# Patient Record
Sex: Male | Born: 1990 | Race: Black or African American | Hispanic: No | Marital: Married | Smoking: Never smoker
Health system: Southern US, Community
[De-identification: ages and names within clinical notes are randomized; demographics above are authoritative.]

## PROBLEM LIST (undated history)

## (undated) ENCOUNTER — Ambulatory Visit (HOSPITAL_COMMUNITY): Admission: EM | Payer: Self-pay | Source: Home / Self Care

## (undated) DIAGNOSIS — F319 Bipolar disorder, unspecified: Secondary | ICD-10-CM

## (undated) DIAGNOSIS — F909 Attention-deficit hyperactivity disorder, unspecified type: Secondary | ICD-10-CM

## (undated) DIAGNOSIS — F32A Depression, unspecified: Secondary | ICD-10-CM

## (undated) DIAGNOSIS — F329 Major depressive disorder, single episode, unspecified: Secondary | ICD-10-CM

## (undated) HISTORY — DX: Major depressive disorder, single episode, unspecified: F32.9

## (undated) HISTORY — DX: Bipolar disorder, unspecified: F31.9

## (undated) HISTORY — DX: Depression, unspecified: F32.A

## (undated) HISTORY — PX: NO PAST SURGERIES: SHX2092

## (undated) HISTORY — DX: Attention-deficit hyperactivity disorder, unspecified type: F90.9

---

## 2011-02-09 ENCOUNTER — Emergency Department (HOSPITAL_BASED_OUTPATIENT_CLINIC_OR_DEPARTMENT_OTHER)
Admission: EM | Admit: 2011-02-09 | Discharge: 2011-02-09 | Disposition: A | Discharge status: Home / Self Care | Payer: 59 | Attending: Emergency Medicine | Admitting: Emergency Medicine

## 2011-02-09 DIAGNOSIS — F319 Bipolar disorder, unspecified: Secondary | ICD-10-CM | POA: Insufficient documentation

## 2011-02-09 DIAGNOSIS — N4830 Priapism, unspecified: Secondary | ICD-10-CM | POA: Insufficient documentation

## 2011-02-09 LAB — POCT TOXICOLOGY PANEL

## 2011-02-09 LAB — URINALYSIS, ROUTINE W REFLEX MICROSCOPIC
Glucose, UA: NEGATIVE mg/dL
Ketones, ur: NEGATIVE mg/dL
Protein, ur: NEGATIVE mg/dL

## 2011-02-09 LAB — DIFFERENTIAL
Eosinophils Absolute: 0.1 10*3/uL (ref 0.0–0.7)
Eosinophils Relative: 2 % (ref 0–5)
Lymphs Abs: 1.4 10*3/uL (ref 0.7–4.0)

## 2011-02-09 LAB — CBC
MCH: 30.8 pg (ref 26.0–34.0)
MCV: 90 fL (ref 78.0–100.0)
Platelets: 238 10*3/uL (ref 150–400)
RDW: 11.2 % — ABNORMAL LOW (ref 11.5–15.5)
WBC: 7.5 10*3/uL (ref 4.0–10.5)

## 2011-02-09 NOTE — ED Provider Notes (Signed)
°

## 2011-02-09 NOTE — ED Notes (Signed)
°

## 2011-02-14 ENCOUNTER — Encounter: Payer: Self-pay | Admitting: Internal Medicine

## 2011-02-14 ENCOUNTER — Ambulatory Visit (INDEPENDENT_AMBULATORY_CARE_PROVIDER_SITE_OTHER): Payer: 59 | Admitting: Internal Medicine

## 2011-02-14 VITALS — BP 134/60 | HR 76 | Temp 98.4°F | Resp 18 | Ht 70.5 in | Wt 244.0 lb

## 2011-02-14 DIAGNOSIS — F319 Bipolar disorder, unspecified: Secondary | ICD-10-CM

## 2011-02-14 DIAGNOSIS — N4830 Priapism, unspecified: Secondary | ICD-10-CM

## 2011-02-14 NOTE — Progress Notes (Signed)
°  Subjective:    Patient ID: Jonathan White, male    DOB: January 16, 1991, 20 y.o.   MRN: [id number]  HPI 20 y/o male to establish Pt seen in ER on Sunday for priapism Total of 4 episodes over last 2 months  Pt currently on mood stabilizer.  Lamictal and other medication (pt does not recall name). He is followed by psychiatrist (Dr. Tomasa Rand). Suspected diagnosis is bipolar meds were started in November  Previous hx of depression  Pt admitted to [facility] for depression x 2 Pt was prev on anti depressant  Woke up Sunday - exp erection x 5-6 hrs Pt received shot x 2 in ER.  Priapism resolved   Review of Systems  Constitutional: Negative for fever, chills, activity change, appetite change, fatigue and unexpected weight change.  HENT: Negative.   Eyes: Negative.   Respiratory: Negative.   Cardiovascular: Negative.   Gastrointestinal: Negative.   Genitourinary: Negative.   Musculoskeletal: Negative.   Neurological: Negative.        Objective:   Physical Exam  Constitutional: He is oriented to person, place, and time. He appears well-developed and well-nourished. No distress.  HENT:  Head: Normocephalic and atraumatic.  Right Ear: External ear normal.  Left Ear: External ear normal.  Mouth/Throat: Oropharynx is clear and moist.  Eyes: Pupils are equal, round, and reactive to light.  Neck: Normal range of motion. Neck supple. No thyromegaly present.  Cardiovascular: Normal rate and normal heart sounds.   Pulmonary/Chest: Effort normal and breath sounds normal.  Abdominal: Soft. Bowel sounds are normal. He exhibits no mass.  Genitourinary: Penis normal.  Musculoskeletal: Normal range of motion.  Lymphadenopathy:    He has no cervical adenopathy.  Neurological: He is oriented to person, place, and time. No cranial nerve deficit.  Skin: Skin is warm and dry.  Psychiatric: He has a normal mood and affect. His behavior is normal.          Assessment & Plan:

## 2011-02-14 NOTE — Patient Instructions (Addendum)
Please call Dr. Tomasa Rand to receive instruction on how to taper off risperidone. Avoid concentrated sweets and decrease your intake of carbohydrates. Return for blood work on Monday 02/17/2011.

## 2011-02-19 ENCOUNTER — Encounter: Payer: Self-pay | Admitting: Internal Medicine

## 2011-02-19 DIAGNOSIS — N4830 Priapism, unspecified: Secondary | ICD-10-CM | POA: Insufficient documentation

## 2011-02-19 NOTE — Assessment & Plan Note (Addendum)
New onset priapism.  Pt not on trazadone.  Nurse called pharm - pt on risperdal. Symptoms correlate to start of medication. Pt to contact Dr Tomasa Rand re:  Tapering off medication  We also discussed risk of DM II and wt gain assoc with atypical agents  Pt counseled on diet and exercise Obtain A1c and FLP

## 2011-03-21 ENCOUNTER — Ambulatory Visit: Payer: 59 | Admitting: Internal Medicine

## 2011-03-21 DIAGNOSIS — Z0289 Encounter for other administrative examinations: Secondary | ICD-10-CM

## 2011-07-03 NOTE — ED Notes (Signed)
°

## 2012-03-20 ENCOUNTER — Emergency Department (HOSPITAL_BASED_OUTPATIENT_CLINIC_OR_DEPARTMENT_OTHER)
Admission: EM | Admit: 2012-03-20 | Discharge: 2012-03-20 | Disposition: A | Discharge status: Home / Self Care | Payer: Commercial Managed Care - PPO | Attending: Emergency Medicine | Admitting: Emergency Medicine

## 2012-03-20 ENCOUNTER — Encounter (HOSPITAL_BASED_OUTPATIENT_CLINIC_OR_DEPARTMENT_OTHER): Payer: Self-pay | Admitting: *Deleted

## 2012-03-20 DIAGNOSIS — N509 Disorder of male genital organs, unspecified: Secondary | ICD-10-CM | POA: Insufficient documentation

## 2012-03-20 DIAGNOSIS — F319 Bipolar disorder, unspecified: Secondary | ICD-10-CM | POA: Insufficient documentation

## 2012-03-20 DIAGNOSIS — N4889 Other specified disorders of penis: Secondary | ICD-10-CM | POA: Insufficient documentation

## 2012-03-20 DIAGNOSIS — K6289 Other specified diseases of anus and rectum: Secondary | ICD-10-CM | POA: Insufficient documentation

## 2012-03-20 DIAGNOSIS — N4830 Priapism, unspecified: Secondary | ICD-10-CM | POA: Insufficient documentation

## 2012-03-20 MED ORDER — TERBUTALINE SULFATE 1 MG/ML IJ SOLN
0.2500 mg | Freq: Once | INTRAMUSCULAR | Status: AC
  Administered 2012-03-20: 0.25 mg via SUBCUTANEOUS
  Filled 2012-03-20: qty 1

## 2012-03-20 MED ORDER — TERBUTALINE SULFATE 1 MG/ML IJ SOLN
0.2500 mg | Freq: Once | INTRAMUSCULAR | Status: AC
  Administered 2012-03-20: 0.25 mg via SUBCUTANEOUS

## 2012-03-20 MED ORDER — ACETAMINOPHEN 500 MG PO TABS
1000.0000 mg | ORAL_TABLET | Freq: Once | ORAL | Status: AC
  Administered 2012-03-20: 1000 mg via ORAL
  Filled 2012-03-20: qty 2

## 2012-03-20 NOTE — Discharge Instructions (Signed)
Priapism  Priapism is a persistent, often painful erection. It is the hardening of the penis in males and of the clitoris in females, even without sexual stimulation. Priapism may come on suddenly. Priapism may last a short while, or may last a long time. Priapism occurs in all ages. The medical terms for these problems include:   Acute prolonged priapism- this means the attack comes on suddenly and lasts.   Recurrent acute priapism- this means the attack comes on suddenly and tends to happen again.   Chronic priapism- this is a persistent condition but with less of an erection.  CAUSES   There are many causes. Causes include:   Blood problems common in these diseases:   Sickle cell disease.   Leukemia.   The most common cause of priapism is the side effects of erectile dysfunction medicine (Viagra, Levitra, and Cialis).   Side effects of prescription medicine used in the treatment of depression and anxiety.   Illegal use of street drugs such as cocaine and marijuana.   The excessive use of alcohol.   Neurological problems such as multiple sclerosis.   Diabetes mellitus.   The cause may be unknown.  TREATMENT   Treatments depend on the cause. Some specific treatments include:   Oxygen and red blood cell transfusions in the patients with sickle cell disease.   A special treatment for plasma in those with leukemia.   Removing blood that is trapped.   Treatment with medicine.   Surgical shunting (a passage that is made to allow blood to flow from one part of the body to another).  The above are specific treatments, there are general treatments that your physician may follow or recommend which can be done at home:   Taking over-the-counter or prescription medicines for pain, discomfort, or fever as directed by your caregiver.   Empty the bladder often once an attack has started and try to keep the bladder empty.   Take warm baths.   Exercise may help.   Increase your fluid intake.  Complications  of priapism:   Impotence.   Scarring know as Peyronie's Disease.  SEEK IMMEDIATE MEDICAL CARE IF:    Your attack lasts longer than usual, especially if it approaches 3-4 hours.   You develop an unexplained temperature of greater than 102 F (38.9 C).   You develop pain which is not relieved with medications.   You develop any new problems which were not present prior to the attack.   You develop nausea or vomiting which prevents you from keeping fluids or medications down.  While the causes are many and the situation can be frightening and confusing, awareness and prompt medical intervention when needed can result in excellent relief and prevent potentially serious and long lasting complications.  Document Released: 01/31/2004 Document Revised: 10/30/2011 Document Reviewed: 04/05/2009  [employer] Patient Information 2012 [employer].

## 2012-03-20 NOTE — ED Notes (Signed)
Placed consult call to [facility] for [facility]-MT

## 2012-03-20 NOTE — ED Notes (Signed)
Pt states this happened to him one other time (a yr ago) and it was r/t the Risperdone or Lamictal. He has not had any med for a month, but started back and woke up like this at 1000. Also c/o rectal discomfort.

## 2012-03-20 NOTE — ED Provider Notes (Signed)
History     CSN: [id number]  Arrival date & time 03/20/12  1246   None     Chief Complaint  Patient presents with  . Penis Pain    (Consider location/radiation/quality/duration/timing/severity/associated sxs/prior treatment) HPI Complains of pain in his penis in the rectum penis since awakening 10 AM today. Patient restarted risperidone yesterday, as treatment for bipolar disorder. Also complains of pain at rexctum, no foreign body in penis or rectum, no treatment prior to coming here no other complaint. Nothing makes symptoms better or worse pain is constant, moderate severity, nonradiating dull in quality Past Medical History  Diagnosis Date  . Bipolar affective disorder     History reviewed. No pertinent past surgical history. Past medical history priapism patient seen here March 2012, evaluated with urinalysis urine drug screen CBC all normal symptoms resolved after treatment with terbutaline Bipolar disorder No family history on file.  History  Substance Use Topics  . Smoking status: Never Smoker   . Smokeless tobacco: Not on file  . Alcohol Use: No      Review of Systems  Constitutional: Negative.   HENT: Negative.   Respiratory: Negative.   Cardiovascular: Negative.   Gastrointestinal: Positive for rectal pain.  Genitourinary: Positive for penile swelling.  Musculoskeletal: Negative.   Skin: Negative.   Neurological: Negative.   Hematological: Negative.   Psychiatric/Behavioral: Negative.   All other systems reviewed and are negative.    Allergies  Review of patient's allergies indicates no known allergies.  Home Medications   Current Outpatient Rx  Name Route Sig Dispense Refill  . LAMOTRIGINE 200 MG PO TABS Oral Take 200 mg by mouth at bedtime.      Marland Kitchen RISPERIDONE 2 MG PO TABS Oral Take 2 mg by mouth at bedtime as needed.        BP 129/70  Pulse 60  Temp(Src) 98.5 F (36.9 C) (Oral)  Resp 18  Ht 6' (1.829 m)  Wt 240 lb (108.863 kg)  BMI  32.55 kg/m2  SpO2 97%  Physical Exam  Nursing note and vitals reviewed. Constitutional: He is oriented to person, place, and time. He appears well-developed and well-nourished.  HENT:  Head: Normocephalic and atraumatic.  Eyes: Conjunctivae are normal. Pupils are equal, round, and reactive to light.  Neck: Neck supple. No tracheal deviation present. No thyromegaly present.  Cardiovascular: Normal rate and regular rhythm.   No murmur heard. Pulmonary/Chest: Effort normal and breath sounds normal.  Abdominal: Soft. Bowel sounds are normal. He exhibits no distension. There is no tenderness.  Genitourinary: Rectum normal and prostate normal.       Penis erect, priapism  Musculoskeletal: Normal range of motion. He exhibits no edema and no tenderness.  Neurological: He is alert and oriented to person, place, and time. Coordination normal.  Skin: Skin is warm and dry. No rash noted.  Psychiatric: He has a normal mood and affect.    ED Course  Procedures (including critical care time)  2:10 PM pain and rectum is much improved after treatment with terbutaline and Tylenol. Pain is still erect second is terbutaline ordered Labs Reviewed - No data to display No results found.   No diagnosis found.  Patient signed out to Dr. Roselyn Bering 4:30 PM  MDM  Patient instructed to stop Risperdal, as priapism as a side effect of Risperdal. He should contact his psychiatrist on 03/22/2012 to be prescribed a different psychiatric medication Diagnosis priapism        Doug Sou, MD 03/20/12 1636

## 2012-07-27 ENCOUNTER — Ambulatory Visit (INDEPENDENT_AMBULATORY_CARE_PROVIDER_SITE_OTHER): Payer: Commercial Managed Care - PPO | Admitting: Family Medicine

## 2012-07-27 VITALS — BP 130/81 | HR 86 | Temp 99.7°F | Resp 16 | Ht 71.5 in | Wt 236.0 lb

## 2012-07-27 DIAGNOSIS — G43909 Migraine, unspecified, not intractable, without status migrainosus: Secondary | ICD-10-CM

## 2012-07-27 DIAGNOSIS — M549 Dorsalgia, unspecified: Secondary | ICD-10-CM

## 2012-07-27 MED ORDER — BUTALBITAL-APAP-CAFFEINE 50-500-40 MG PO TABS: 1.0000 | ORAL_TABLET | ORAL | Status: AC | PRN

## 2012-07-27 MED ORDER — METHOCARBAMOL 500 MG PO TABS: 500.0000 mg | ORAL_TABLET | Freq: Every evening | ORAL | Status: AC | PRN

## 2012-07-27 MED ORDER — IBUPROFEN 800 MG PO TABS: 800.0000 mg | ORAL_TABLET | Freq: Three times a day (TID) | ORAL | Status: AC | PRN

## 2012-07-27 NOTE — Progress Notes (Signed)
 [facility]:  Office Visit  Chief Complaint:  Chief Complaint  Patient presents with  . Migraine    since Sun  . Back Pain    yesterday  . Lymphadenopathy    today    HPI: Jonathan White is a 21 y.o. male who complains of: 1. Migraine x 2 days. Took 3 ibuprofen  x 3 without improvement.  Uncomfortable band in back of head.  Under some stress since now at [employer] Has a history of migraine HAs which lasts 1/2-1 day, and usually goes away in dark room. This one has lingered on for a little longer. Denies vision changes, denies photo or phonophobia Has not taken any new medications. Has been off of his bipolar meds, stopped in April.  2. Back pain-upper back x 1 day. He has been looking down an writing more since starting school at [employer] 3. Sharp pain on lower left side x 1 day. Deneis fevers, nausea, vomiting. + BM, + gas, No blood in stool or urine. Denies dysuria.   Past Medical History  Diagnosis Date  . Bipolar affective disorder   . Bipolar 1 disorder    History reviewed. No pertinent past surgical history. History   Social History  . Marital Status: Single    Spouse Name: N/A    Number of Children: N/A  . Years of Education: N/A   Occupational History  . Student [employer]   Social History Main Topics  . Smoking status: Never Smoker   . Smokeless tobacco: None  . Alcohol Use: No  . Drug Use: No  . Sexually Active: Yes -- Male partner(s)   Other Topics Concern  . 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 Hypertension Father    No Known Allergies Prior to Admission medications   Not on File     ROS: The patient denies fevers, chills, night sweats, unintentional weight loss, chest pain, palpitations, wheezing, dyspnea  on exertion, nausea, vomiting, abdominal pain, dysuria, hematuria, melena, numbness, weakness, or tingling.  All other systems have been reviewed and were otherwise negative with the exception of those mentioned in the HPI and as above.    PHYSICAL EXAM: Filed Vitals:   07/27/12 1953  BP: 130/81  Pulse: 86  Temp: 99.7 F (37.6 C)  Resp: 16   Filed Vitals:   07/27/12 1953  Height: 5' 11.5" (1.816 m)  Weight: 236 lb (107.049 kg)   Body mass index is 32.46 kg/(m^2).  General: Alert, slightly anxious HEENT:  Normocephalic, atraumatic, oropharynx patent.  Cardiovascular:  Regular rate and rhythm, no rubs murmurs or gallops.  No Carotid bruits, radial pulse intact. No pedal edema.  Respiratory: Clear to auscultation bilaterally.  No wheezes, rales, or rhonchi.  No cyanosis, no use of accessory musculature GI: No organomegaly, abdomen is soft and non-tender, positive bowel sounds.  No masses. Skin: No rashes. Neurologic: Facial musculature symmetric. CN 2-12 grossly intact Psychiatric: Patient is appropriate throughout our interaction. Lymphatic: No cervical lymphadenopathy Musculoskeletal: Gait intact.   LABS:    EKG/XRAY:   Primary read interpreted by Dr. Buck Carbon at [facility]   ASSESSMENT/PLAN: Encounter Diagnoses  Name Primary?  . Back pain Yes  . Migraine headache    Patient is anxious, Advise to monitor for worsening s/sx  Rx Ibuprofen , Fioricet If no improvement then f/u prn    Khairi Garman PHUONG, DO 07/28/2012 7:57 AM

## 2012-07-29 NOTE — Patient Instructions (Signed)
 Back Exercises Back exercises help treat and prevent back injuries. The goal of back exercises is to increase the strength of your abdominal and back muscles and the flexibility of your back. These exercises should be started when you no longer have back pain. Back exercises include:  Pelvic Tilt. Lie on your back with your knees bent. Tilt your pelvis until the lower part of your back is against the floor. Hold this position 5 to 10 sec and repeat 5 to 10 times.   Knee to Chest. Pull first 1 knee up against your chest and hold for 20 to 30 seconds, repeat this with the other knee, and then both knees. This may be done with the other leg straight or bent, whichever feels better.   Sit-Ups or Curl-Ups. Bend your knees 90 degrees. Start with tilting your pelvis, and do a partial, slow sit-up, lifting your trunk only 30 to 45 degrees off the floor. Take at least 2 to 3 seconds for each sit-up. Do not do sit-ups with your knees out straight. If partial sit-ups are difficult, simply do the above but with only tightening your abdominal muscles and holding it as directed.   Hip-Lift. Lie on your back with your knees flexed 90 degrees. Push down with your feet and shoulders as you raise your hips a couple inches off the floor; hold for 10 seconds, repeat 5 to 10 times.   Back arches. Lie on your stomach, propping yourself up on bent elbows. Slowly press on your hands, causing an arch in your low back. Repeat 3 to 5 times. Any initial stiffness and discomfort should lessen with repetition over time.   Shoulder-Lifts. Lie face down with arms beside your body. Keep hips and torso pressed to floor as you slowly lift your head and shoulders off the floor.  Do not overdo your exercises, especially in the beginning. Exercises may cause you some mild back discomfort which lasts for a few minutes; however, if the pain is more severe, or lasts for more than 15 minutes, do not continue exercises until you see your  caregiver. Improvement with exercise therapy for back problems is slow.   See your caregivers for assistance with developing a proper back exercise program. Document Released: 12/18/2004 Document Revised: 10/30/2011 Document Reviewed: 11/10/2005 [facility] Patient Information 2012 [place], [place].Recurrent Migraine Headache A migraine is a headache that keeps coming back (recurrent headaches) and has additional problems. Migraines usually:  Occur on one side of the head.   Throb or pound (pulsate).   Stop you from doing daily activities.   Start when doing daily activities.  You may also feel sick to your stomach (nauseous), throw up (vomit), or have pain with loud noises or bright lights. HOME CARE Your doctor may you with medicine and a plan to take the medicine. Take the medicine as told by your doctor. GET HELP RIGHT AWAY IF:    You have a fever.   You have a stiff neck.   You have vision loss or changes in your vision.   You feel lightheaded, lose your balance, or pass out (faint).   You have muscle weakness.   You lose muscle control.   You have new problems.   You have trouble walking.   Your medicine does not help.   Your pain comes back.   Your headaches start to change or become worse.  MAKE SURE YOU:    Understand these instructions.   Will watch this condition.   Will get  help right away if you are not doing well or get worse.  Document Released: 08/19/2008 Document Revised: 10/30/2011 Document Reviewed: 08/19/2008 [facility] Patient Information 2012 [place], [place].

## 2013-05-04 ENCOUNTER — Emergency Department (HOSPITAL_BASED_OUTPATIENT_CLINIC_OR_DEPARTMENT_OTHER)
Admission: EM | Admit: 2013-05-04 | Discharge: 2013-05-04 | Disposition: A | Discharge status: Home / Self Care | Payer: Commercial Managed Care - PPO | Attending: Emergency Medicine | Admitting: Emergency Medicine

## 2013-05-04 ENCOUNTER — Encounter (HOSPITAL_BASED_OUTPATIENT_CLINIC_OR_DEPARTMENT_OTHER): Payer: Self-pay | Admitting: *Deleted

## 2013-05-04 DIAGNOSIS — J069 Acute upper respiratory infection, unspecified: Secondary | ICD-10-CM | POA: Insufficient documentation

## 2013-05-04 DIAGNOSIS — J3489 Other specified disorders of nose and nasal sinuses: Secondary | ICD-10-CM | POA: Insufficient documentation

## 2013-05-04 DIAGNOSIS — Z8659 Personal history of other mental and behavioral disorders: Secondary | ICD-10-CM | POA: Insufficient documentation

## 2013-05-04 LAB — RAPID STREP SCREEN (MED CTR MEBANE ONLY): Streptococcus, Group A Screen (Direct): NEGATIVE

## 2013-05-04 MED ORDER — MUCINEX DM 30-600 MG PO TB12: 1.0000 | ORAL_TABLET | Freq: Two times a day (BID) | ORAL | Status: DC

## 2013-05-04 NOTE — ED Notes (Signed)
Pt amb to room 10 with quick steady gait in nad. Pt reports 2 weeks of head pressure, sore throat and congestion. Productive cough also.

## 2013-05-04 NOTE — ED Provider Notes (Signed)
History     CSN: [id number]  Arrival date & time 05/04/13  1133   First MD Initiated Contact with Patient 05/04/13 1158      Chief Complaint  Patient presents with   Sore Throat    (Consider location/radiation/quality/duration/timing/severity/associated sxs/prior treatment) HPI Pt presenting with c/o sore throat and nasal congestion.  He states symptoms got better last week and then sore throat recurred over the past 3 days.  No fever.  He denies significant cough to me despite nursing triage note.  Pt has not had any treatment for his symptoms.  Has continued to drink liquids well.  There are no other associated systemic symptoms, there are no other alleviating or modifying factors.   Past Medical History  Diagnosis Date   Bipolar affective disorder    Bipolar 1 disorder     History reviewed. No pertinent past surgical history.  Family History  Problem Relation Age of Onset   Hypertension Father     History  Substance Use Topics   Smoking status: Never Smoker    Smokeless tobacco: Not on file   Alcohol Use: No      Review of Systems ROS reviewed and all otherwise negative except for mentioned in HPI   Allergies  Review of patient's allergies indicates no known allergies.  Home Medications   Current Outpatient Rx  Name  Route  Sig  Dispense  Refill   Dextromethorphan-Guaifenesin (MUCINEX DM) 30-600 MG TB12   Oral   Take 1 tablet by mouth 2 (two) times daily.   14 each   0     BP 147/88   Pulse 68   Temp(Src) 99.1 F (37.3 C) (Oral)   Resp 18   Ht 6' (1.829 m)   Wt 235 lb (106.595 kg)   BMI 31.86 kg/m2   SpO2 100% Vitals reviewed Physical Exam Physical Examination: General appearance - alert, well appearing, and in no distress Mental status - alert, oriented to person, place, and time Head- NCAT, no ttp over frontal or maxillary sinuses Eyes - no conjunctival injection, no scleral icterus Nose - normal and patent, no erythema, discharge or  polyps Mouth - mucous membranes moist, pharynx normal without lesions, mild erythema of OP, no exudate, no erythema Neck - supple, no significant adenopathy Chest - clear to auscultation, no wheezes, rales or rhonchi, symmetric air entry Heart - normal rate, regular rhythm, normal S1, S2, no murmurs, rubs, clicks or gallops Abdomen - soft, nontender, nondistended, no masses or organomegaly Extremities - peripheral pulses normal, no pedal edema, no clubbing or cyanosis Skin - normal coloration and turgor, no rashes  ED Course  Procedures (including critical care time)  Labs Reviewed  RAPID STREP SCREEN  CULTURE, GROUP A STREP   No results found.   1. URI (upper respiratory infection)       MDM  Pt presenting with c/o nasal congestion and sore throat.  Strep screen negative.  Pt afebrile.  No ttp over frontal or maxillary sinuses.  Advised symptomatic care, given rx for mucinex DM.   Discharged with strict return precautions.  Pt agreeable with plan.        Ethelda Chick, MD 05/04/13 [phone]

## 2013-05-06 LAB — CULTURE, GROUP A STREP

## 2015-07-05 ENCOUNTER — Encounter (HOSPITAL_BASED_OUTPATIENT_CLINIC_OR_DEPARTMENT_OTHER): Payer: Self-pay | Admitting: *Deleted

## 2015-07-05 ENCOUNTER — Emergency Department (HOSPITAL_BASED_OUTPATIENT_CLINIC_OR_DEPARTMENT_OTHER)
Admission: EM | Admit: 2015-07-05 | Discharge: 2015-07-05 | Disposition: A | Discharge status: Home / Self Care | Payer: 59 | Attending: Emergency Medicine | Admitting: Emergency Medicine

## 2015-07-05 DIAGNOSIS — Y92410 Unspecified street and highway as the place of occurrence of the external cause: Secondary | ICD-10-CM | POA: Diagnosis not present

## 2015-07-05 DIAGNOSIS — Y998 Other external cause status: Secondary | ICD-10-CM | POA: Insufficient documentation

## 2015-07-05 DIAGNOSIS — S8992XA Unspecified injury of left lower leg, initial encounter: Secondary | ICD-10-CM | POA: Diagnosis not present

## 2015-07-05 DIAGNOSIS — S3992XA Unspecified injury of lower back, initial encounter: Secondary | ICD-10-CM | POA: Diagnosis present

## 2015-07-05 DIAGNOSIS — Z79899 Other long term (current) drug therapy: Secondary | ICD-10-CM | POA: Insufficient documentation

## 2015-07-05 DIAGNOSIS — Z8659 Personal history of other mental and behavioral disorders: Secondary | ICD-10-CM | POA: Insufficient documentation

## 2015-07-05 DIAGNOSIS — Y9389 Activity, other specified: Secondary | ICD-10-CM | POA: Insufficient documentation

## 2015-07-05 MED ORDER — HYDROCODONE-ACETAMINOPHEN 5-325 MG PO TABS: 1.0000 | ORAL_TABLET | Freq: Four times a day (QID) | ORAL | Status: DC | PRN

## 2015-07-05 NOTE — Discharge Instructions (Signed)
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
## 2015-07-05 NOTE — ED Notes (Signed)
Pt ambulating independently w/ steady gait on d/c in no acute distress, A&Ox4. D/c instructions reviewed w/ pt and family - pt and family deny any further questions or concerns at present. Rx given x1
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
## 2016-01-04 ENCOUNTER — Ambulatory Visit (INDEPENDENT_AMBULATORY_CARE_PROVIDER_SITE_OTHER): Payer: 59 | Admitting: Psychiatry

## 2016-01-04 ENCOUNTER — Encounter (INDEPENDENT_AMBULATORY_CARE_PROVIDER_SITE_OTHER): Payer: Self-pay

## 2016-01-04 ENCOUNTER — Encounter (HOSPITAL_COMMUNITY): Payer: Self-pay | Admitting: Psychiatry

## 2016-01-04 VITALS — BP 132/78 | HR 68 | Ht 72.0 in | Wt 254.0 lb

## 2016-01-04 DIAGNOSIS — F1210 Cannabis abuse, uncomplicated: Secondary | ICD-10-CM | POA: Diagnosis not present

## 2016-01-04 DIAGNOSIS — F3112 Bipolar disorder, current episode manic without psychotic features, moderate: Secondary | ICD-10-CM

## 2016-01-04 MED ORDER — LAMOTRIGINE 25 MG PO TABS: ORAL_TABLET | ORAL | Status: DC

## 2016-01-04 MED ORDER — HYDROXYZINE PAMOATE 25 MG PO CAPS: 25.0000 mg | ORAL_CAPSULE | Freq: Every evening | ORAL | Status: DC | PRN
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
## 2016-01-29 ENCOUNTER — Ambulatory Visit (INDEPENDENT_AMBULATORY_CARE_PROVIDER_SITE_OTHER): Payer: 59 | Admitting: Psychiatry

## 2016-01-29 ENCOUNTER — Encounter (HOSPITAL_COMMUNITY): Payer: Self-pay | Admitting: Psychiatry

## 2016-01-29 VITALS — BP 112/74 | HR 98 | Ht 72.0 in | Wt 251.4 lb

## 2016-01-29 DIAGNOSIS — F3112 Bipolar disorder, current episode manic without psychotic features, moderate: Secondary | ICD-10-CM

## 2016-01-29 DIAGNOSIS — F1210 Cannabis abuse, uncomplicated: Secondary | ICD-10-CM

## 2016-01-29 MED ORDER — LAMOTRIGINE 100 MG PO TABS: 100.0000 mg | ORAL_TABLET | Freq: Every day | ORAL | Status: DC
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
## 2016-02-04 ENCOUNTER — Ambulatory Visit (INDEPENDENT_AMBULATORY_CARE_PROVIDER_SITE_OTHER): Payer: 59 | Admitting: Psychology

## 2016-02-04 ENCOUNTER — Encounter (HOSPITAL_COMMUNITY): Payer: Self-pay | Admitting: Psychology

## 2016-02-04 DIAGNOSIS — F3132 Bipolar disorder, current episode depressed, moderate: Secondary | ICD-10-CM

## 2016-02-05 ENCOUNTER — Encounter (HOSPITAL_COMMUNITY): Payer: Self-pay | Admitting: Psychology
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
## 2016-02-26 ENCOUNTER — Encounter (HOSPITAL_COMMUNITY): Payer: Self-pay

## 2016-02-26 ENCOUNTER — Telehealth: Payer: Self-pay | Admitting: *Deleted

## 2016-02-26 ENCOUNTER — Emergency Department (HOSPITAL_COMMUNITY)
Admission: EM | Admit: 2016-02-26 | Discharge: 2016-02-26 | Disposition: A | Discharge status: Home / Self Care | Payer: 59 | Attending: Emergency Medicine | Admitting: Emergency Medicine

## 2016-02-26 DIAGNOSIS — R34 Anuria and oliguria: Secondary | ICD-10-CM | POA: Insufficient documentation

## 2016-02-26 DIAGNOSIS — R110 Nausea: Secondary | ICD-10-CM | POA: Diagnosis not present

## 2016-02-26 DIAGNOSIS — R197 Diarrhea, unspecified: Secondary | ICD-10-CM

## 2016-02-26 DIAGNOSIS — F319 Bipolar disorder, unspecified: Secondary | ICD-10-CM | POA: Insufficient documentation

## 2016-02-26 DIAGNOSIS — Z79899 Other long term (current) drug therapy: Secondary | ICD-10-CM | POA: Diagnosis not present

## 2016-02-26 LAB — GASTROINTESTINAL PANEL BY PCR, STOOL (REPLACES STOOL CULTURE)
ADENOVIRUS F40/41: NOT DETECTED
ASTROVIRUS: NOT DETECTED
CYCLOSPORA CAYETANENSIS: NOT DETECTED
Campylobacter species: NOT DETECTED
Cryptosporidium: NOT DETECTED
E. coli O157: NOT DETECTED
ENTEROAGGREGATIVE E COLI (EAEC): DETECTED — AB
ENTEROPATHOGENIC E COLI (EPEC): NOT DETECTED
ENTEROTOXIGENIC E COLI (ETEC): NOT DETECTED
Entamoeba histolytica: NOT DETECTED
GIARDIA LAMBLIA: DETECTED — AB
NOROVIRUS GI/GII: NOT DETECTED
Plesimonas shigelloides: NOT DETECTED
Rotavirus A: NOT DETECTED
SHIGA LIKE TOXIN PRODUCING E COLI (STEC): NOT DETECTED
Salmonella species: NOT DETECTED
Sapovirus (I, II, IV, and V): NOT DETECTED
Shigella/Enteroinvasive E coli (EIEC): NOT DETECTED
VIBRIO CHOLERAE: NOT DETECTED
Vibrio species: NOT DETECTED
Yersinia enterocolitica: NOT DETECTED

## 2016-02-26 LAB — I-STAT CHEM 8, ED
BUN: 9 mg/dL (ref 6–20)
CALCIUM ION: 1.18 mmol/L (ref 1.12–1.23)
Chloride: 103 mmol/L (ref 101–111)
Creatinine, Ser: 1 mg/dL (ref 0.61–1.24)
Glucose, Bld: 91 mg/dL (ref 65–99)
HCT: 47 % (ref 39.0–52.0)
Hemoglobin: 16 g/dL (ref 13.0–17.0)
Potassium: 3.7 mmol/L (ref 3.5–5.1)
SODIUM: 141 mmol/L (ref 135–145)
TCO2: 25 mmol/L (ref 0–100)

## 2016-02-26 LAB — C DIFFICILE QUICK SCREEN W PCR REFLEX
C DIFFICILE (CDIFF) TOXIN: NEGATIVE
C DIFFICLE (CDIFF) ANTIGEN: NEGATIVE
C Diff interpretation: NEGATIVE

## 2016-02-26 MED ORDER — METOCLOPRAMIDE HCL 5 MG/ML IJ SOLN
10.0000 mg | Freq: Once | INTRAMUSCULAR | Status: AC
  Administered 2016-02-26: 10 mg via INTRAVENOUS
  Filled 2016-02-26: qty 2

## 2016-02-26 MED ORDER — PROMETHAZINE HCL 25 MG PO TABS: 25.0000 mg | ORAL_TABLET | Freq: Four times a day (QID) | ORAL | Status: DC | PRN

## 2016-02-26 MED ORDER — SODIUM CHLORIDE 0.9 % IV BOLUS (SEPSIS)
1000.0000 mL | Freq: Once | INTRAVENOUS | Status: AC
  Administered 2016-02-26: 1000 mL via INTRAVENOUS

## 2016-02-26 MED ORDER — LACTINEX PO PACK: PACK | ORAL | Status: DC

## 2016-02-26 NOTE — Discharge Instructions (Signed)
Diarrhea Diarrhea is frequent loose and watery bowel movements. It can cause you to feel weak and dehydrated. Dehydration can cause you to become tired and thirsty, have a dry mouth, and have decreased urination that often is dark yellow. Diarrhea is a sign of another problem, most often an infection that will not last long. In most cases, diarrhea typically lasts 2-3 days. However, it can last longer if it is a sign of something more serious. It is important to treat your diarrhea as directed by your caregiver to lessen or prevent future episodes of diarrhea. CAUSES  Some common causes include:  Gastrointestinal infections caused by viruses, bacteria, or parasites.  Food poisoning or food allergies.  Certain medicines, such as antibiotics, chemotherapy, and laxatives.  Artificial sweeteners and fructose.  Digestive disorders. HOME CARE INSTRUCTIONS  Ensure adequate fluid intake (hydration): Have 1 cup (8 oz) of fluid for each diarrhea episode. Avoid fluids that contain simple sugars or sports drinks, fruit juices, whole milk products, and sodas. Your urine should be clear or pale yellow if you are drinking enough fluids. Hydrate with an oral rehydration solution that you can purchase at pharmacies, retail stores, and online. You can prepare an oral rehydration solution at home by mixing the following ingredients together:   - tsp table salt.   tsp baking soda.   tsp salt substitute containing potassium chloride.  1  tablespoons sugar.  1 L (34 oz) of water.  Certain foods and beverages may increase the speed at which food moves through the gastrointestinal (GI) tract. These foods and beverages should be avoided and include:  Caffeinated and alcoholic beverages.  High-fiber foods, such as raw fruits and vegetables, nuts, seeds, and whole grain breads and cereals.  Foods and beverages sweetened with sugar alcohols, such as xylitol, sorbitol, and mannitol.  Some foods may be well  tolerated and may help thicken stool including:  Starchy foods, such as rice, toast, pasta, low-sugar cereal, oatmeal, grits, baked potatoes, crackers, and bagels.  Bananas.  Applesauce.  Add probiotic-rich foods to help increase healthy bacteria in the GI tract, such as yogurt and fermented milk products.  Wash your hands well after each diarrhea episode.  Only take over-the-counter or prescription medicines as directed by your caregiver.  Take a warm bath to relieve any burning or pain from frequent diarrhea episodes. SEEK IMMEDIATE MEDICAL CARE IF:   You are unable to keep fluids down.  You have persistent vomiting.  You have blood in your stool, or your stools are black and tarry.  You do not urinate in 6-8 hours, or there is only a small amount of very dark urine.  You have abdominal pain that increases or localizes.  You have weakness, dizziness, confusion, or light-headedness.  You have a severe headache.  Your diarrhea gets worse or does not get better.  You have a fever or persistent symptoms for more than 2-3 days.  You have a fever and your symptoms suddenly get worse. MAKE SURE YOU:   Understand these instructions.  Will watch your condition.  Will get help right away if you are not doing well or get worse.   This information is not intended to replace advice given to you by your health care provider. Make sure you discuss any questions you have with your health care provider.   Document Released: 10/31/2002 Document Revised: 12/01/2014 Document Reviewed: 07/18/2012 Elsevier Interactive Patient Education 2016 Elsevier Inc.   Food Choices to Help Relieve Diarrhea, Adult When you have   diarrhea, the foods you eat and your eating habits are very important. Choosing the right foods and drinks can help relieve diarrhea. Also, because diarrhea can last up to 7 days, you need to replace lost fluids and electrolytes (such as sodium, potassium, and chloride) in  order to help prevent dehydration.  WHAT GENERAL GUIDELINES DO I NEED TO FOLLOW?  Slowly drink 1 cup (8 oz) of fluid for each episode of diarrhea. If you are getting enough fluid, your urine will be clear or pale yellow.  Eat starchy foods. Some good choices include white rice, white toast, pasta, low-fiber cereal, baked potatoes (without the skin), saltine crackers, and bagels.  Avoid large servings of any cooked vegetables.  Limit fruit to two servings per day. A serving is  cup or 1 small piece.  Choose foods with less than 2 g of fiber per serving.  Limit fats to less than 8 tsp (38 g) per day.  Avoid fried foods.  Eat foods that have probiotics in them. Probiotics can be found in certain dairy products.  Avoid foods and beverages that may increase the speed at which food moves through the stomach and intestines (gastrointestinal tract). Things to avoid include:  High-fiber foods, such as dried fruit, raw fruits and vegetables, nuts, seeds, and whole grain foods.  Spicy foods and high-fat foods.  Foods and beverages sweetened with high-fructose corn syrup, honey, or sugar alcohols such as xylitol, sorbitol, and mannitol. WHAT FOODS ARE RECOMMENDED? Grains White rice. White, French, or pita breads (fresh or toasted), including plain rolls, buns, or bagels. White pasta. Saltine, soda, or graham crackers. Pretzels. Low-fiber cereal. Cooked cereals made with water (such as cornmeal, farina, or cream cereals). Plain muffins. Matzo. Melba toast. Zwieback.  Vegetables Potatoes (without the skin). Strained tomato and vegetable juices. Most well-cooked and canned vegetables without seeds. Tender lettuce. Fruits Cooked or canned applesauce, apricots, cherries, fruit cocktail, grapefruit, peaches, pears, or plums. Fresh bananas, apples without skin, cherries, grapes, cantaloupe, grapefruit, peaches, oranges, or plums.  Meat and Other Protein Products Baked or boiled chicken. Eggs. Tofu.  Fish. Seafood. Smooth peanut butter. Ground or well-cooked tender beef, ham, veal, lamb, pork, or poultry.  Dairy Plain yogurt, kefir, and unsweetened liquid yogurt. Lactose-free milk, buttermilk, or soy milk. Plain hard cheese. Beverages Sport drinks. Clear broths. Diluted fruit juices (except prune). Regular, caffeine-free sodas such as ginger ale. Water. Decaffeinated teas. Oral rehydration solutions. Sugar-free beverages not sweetened with sugar alcohols. Other Bouillon, broth, or soups made from recommended foods.  The items listed above may not be a complete list of recommended foods or beverages. Contact your dietitian for more options. WHAT FOODS ARE NOT RECOMMENDED? Grains Whole grain, whole wheat, bran, or rye breads, rolls, pastas, crackers, and cereals. Wild or brown rice. Cereals that contain more than 2 g of fiber per serving. Corn tortillas or taco shells. Cooked or dry oatmeal. Granola. Popcorn. Vegetables Raw vegetables. Cabbage, broccoli, Brussels sprouts, artichokes, baked beans, beet greens, corn, kale, legumes, peas, sweet potatoes, and yams. Potato skins. Cooked spinach and cabbage. Fruits Dried fruit, including raisins and dates. Raw fruits. Stewed or dried prunes. Fresh apples with skin, apricots, mangoes, pears, raspberries, and strawberries.  Meat and Other Protein Products Chunky peanut butter. Nuts and seeds. Beans and lentils. Bacon.  Dairy High-fat cheeses. Milk, chocolate milk, and beverages made with milk, such as milk shakes. Cream. Ice cream. Sweets and Desserts Sweet rolls, doughnuts, and sweet breads. Pancakes and waffles. Fats and Oils Butter. Cream sauces.   Margarine. Salad oils. Plain salad dressings. Olives. Avocados.  Beverages Caffeinated beverages (such as coffee, tea, soda, or energy drinks). Alcoholic beverages. Fruit juices with pulp. Prune juice. Soft drinks sweetened with high-fructose corn syrup or sugar alcohols. Other Coconut. Hot sauce.  Chili powder. Mayonnaise. Gravy. Cream-based or milk-based soups.  The items listed above may not be a complete list of foods and beverages to avoid. Contact your dietitian for more information. WHAT SHOULD I DO IF I BECOME DEHYDRATED? Diarrhea can sometimes lead to dehydration. Signs of dehydration include dark urine and dry mouth and skin. If you think you are dehydrated, you should rehydrate with an oral rehydration solution. These solutions can be purchased at pharmacies, retail stores, or online.  Drink -1 cup (120-240 mL) of oral rehydration solution each time you have an episode of diarrhea. If drinking this amount makes your diarrhea worse, try drinking smaller amounts more often. For example, drink 1-3 tsp (5-15 mL) every 5-10 minutes.  A general rule for staying hydrated is to drink 1-2 L of fluid per day. Talk to your health care provider about the specific amount you should be drinking each day. Drink enough fluids to keep your urine clear or pale yellow.   This information is not intended to replace advice given to you by your health care provider. Make sure you discuss any questions you have with your health care provider.   Document Released: 01/31/2004 Document Revised: 12/01/2014 Document Reviewed: 10/03/2013 Elsevier Interactive Patient Education 2016 Elsevier Inc.   

## 2016-02-26 NOTE — ED Notes (Signed)
Contacted by laboratory with (+)GI PCR results. Chart reviewed by Virgel Manifold, MD who recommends treatment with Ciprofloxacin 500mg  PO BID x 7 days and Flagyl 500mg  PO TID x 7 days.  Patient notified of test results and prescriptions called to [facility], [phone]

## 2016-02-26 NOTE — ED Notes (Signed)
Patient d/c'd self care.  F/U and medications reviewed.  Patient verbalized understanding.

## 2016-02-26 NOTE — ED Notes (Signed)
Pt complains of diarrhea for two days, no vomiting

## 2016-02-26 NOTE — ED Provider Notes (Signed)
CSN: [id number]     Arrival date & time 02/26/16  0349 History   First MD Initiated Contact with Patient 02/26/16 0407     Chief Complaint  Patient presents with   Diarrhea     (Consider location/radiation/quality/duration/timing/severity/associated sxs/prior Treatment) HPI Comments: 25 year old male with a history of bipolar affective disorder presents to the emergency department for evaluation of diarrhea. Patient states that diarrhea has been present for the last 2-3 days. He describes the diarrhea as watery. He has noted some bright red blood on his toilet tissue when wiping at times. No blood noted in the toilet all. No black stool. Patient reports some mild associated nausea. He has taken Pepto Bismol without relief. He has not had any abdominal pain or cramping, fever, vomiting, dysuria or hematuria. He does believe that he has been voiding less since onset of his symptoms. Only recent travel includes a recent trip to [place], [place]. No international travel or contact with sick persons. No hx of abdominal surgeries.   Patient is a 25 y.o. male presenting with diarrhea. The history is provided by the patient. No language interpreter was used.  Diarrhea Associated symptoms: no abdominal pain, no fever and no vomiting     Past Medical History  Diagnosis Date   Bipolar affective disorder ([place])    Bipolar 1 disorder ([place])    History reviewed. No pertinent past surgical history. Family History  Problem Relation Age of Onset   Hypertension Father    Schizophrenia Brother    Alcohol abuse Sister    Social History  Substance Use Topics   Smoking status: Never Smoker    Smokeless tobacco: None   Alcohol Use: Yes     Comment: 4x a month    Review of Systems  Constitutional: Negative for fever.  Gastrointestinal: Positive for nausea and diarrhea. Negative for vomiting and abdominal pain.  Genitourinary: Positive for decreased urine volume. Negative for dysuria and  hematuria.  Neurological: Negative for syncope.  All other systems reviewed and are negative.   Allergies  Review of patient's allergies indicates no known allergies.  Home Medications   Prior to Admission medications   Medication Sig Start Date End Date Taking? Authorizing Provider  bismuth subsalicylate (PEPTO BISMOL) 262 MG chewable tablet Chew 524 mg by mouth as needed for indigestion or diarrhea or loose stools.   Yes Historical Provider, MD  hydrOXYzine (VISTARIL) 25 MG capsule Take 1 capsule (25 mg total) by mouth at bedtime as needed for anxiety. 01/04/16  Yes Kathlee Nations, MD  lamoTRIgine (LAMICTAL) 100 MG tablet Take 1 tablet (100 mg total) by mouth daily. 01/29/16  Yes Kathlee Nations, MD  Lactobacillus (LACTINEX) PACK Mix 1/2 packet with soft food and take twice a day for 5 days. 02/26/16   Antonietta Breach, PA-C  promethazine (PHENERGAN) 25 MG tablet Take 1 tablet (25 mg total) by mouth every 6 (six) hours as needed for nausea or vomiting. 02/26/16   Antonietta Breach, PA-C   BP 144/90 mmHg   Pulse 72   Temp(Src) 98.5 F (36.9 C) (Oral)   Resp 16   Ht 6' (1.829 m)   Wt 113.399 kg   BMI 33.90 kg/m2   SpO2 99%   Physical Exam  Constitutional: He is oriented to person, place, and time. He appears well-developed and well-nourished. No distress.  Nontoxic/nonseptic appearing  HENT:  Head: Normocephalic and atraumatic.  Mucous membranes moist.  Eyes: Conjunctivae and EOM are normal. No scleral icterus.  Neck: Normal  range of motion.  Cardiovascular: Normal rate, regular rhythm and intact distal pulses.   Pulmonary/Chest: Effort normal. No respiratory distress. He has no wheezes. He has no rales.  Respirations even and unlabored. Lungs clear bilaterally.  Abdominal: Soft. He exhibits no distension. There is no tenderness. There is no rebound.  Soft abdomen without masses or focal tenderness. No peritoneal signs or rigidity.  Musculoskeletal: Normal range of motion.  Neurological: He is alert  and oriented to person, place, and time. He exhibits normal muscle tone. Coordination normal.  Skin: Skin is warm and dry. No rash noted. He is not diaphoretic. No erythema. No pallor.  Psychiatric: He has a normal mood and affect. His behavior is normal.  Nursing note and vitals reviewed.   ED Course  Procedures (including critical care time) Labs Review Labs Reviewed  C DIFFICILE QUICK SCREEN W PCR REFLEX  GASTROINTESTINAL PANEL BY PCR, STOOL (REPLACES STOOL CULTURE)  I-STAT CHEM 8, ED    Imaging Review No results found.   I have personally reviewed and evaluated these images and lab results as part of my medical decision-making.   EKG Interpretation None      MDM   Final diagnoses:  Diarrhea, unspecified type    25 year old male present to the emergency department for evaluation of diarrhea 3 days. Diarrhea is nonbloody and watery. Patient has no complaints of abdominal pain. His abdominal exam is nontender. No electrolyte abnormalities. No clinical signs of dehydration and kidney function is normal. Patient has been hydrated with 1 L of IV fluids. He has a gastrointestinal panel pending. Initial C. difficile is negative.   Patient able to tolerate oral fluids in the emergency department. Plan to discharge with a probiotic and nausea medication to ensure proper and continued oral fluid hydration. No indication for further emergent workup at this time. Patient referred to gastroenterology if symptoms persist or worsen. Return precautions given at discharge. Patient agreeable to plan with no unaddressed concerns; discharged in good condition.   Filed Vitals:   02/26/16 0355 02/26/16 0557  BP: 140/98 144/90  Pulse: 72 72  Temp: 98.5 F (36.9 C)   TempSrc: Oral   Resp: 18 16  Height: 6' (1.829 m)   Weight: 113.399 kg   SpO2: 98% 99%       Antonietta Breach, PA-C 02/26/16 [id number]  Lacretia Leigh, MD 02/28/16 1902

## 2016-03-03 ENCOUNTER — Encounter (HOSPITAL_COMMUNITY): Payer: Self-pay | Admitting: Psychiatry

## 2016-03-03 ENCOUNTER — Ambulatory Visit (INDEPENDENT_AMBULATORY_CARE_PROVIDER_SITE_OTHER): Payer: 59 | Admitting: Psychiatry

## 2016-03-03 VITALS — BP 138/86 | HR 72 | Ht 72.0 in | Wt 249.2 lb

## 2016-03-03 DIAGNOSIS — F3112 Bipolar disorder, current episode manic without psychotic features, moderate: Secondary | ICD-10-CM

## 2016-03-03 DIAGNOSIS — F1210 Cannabis abuse, uncomplicated: Secondary | ICD-10-CM | POA: Diagnosis not present

## 2016-03-03 MED ORDER — LAMOTRIGINE 150 MG PO TABS: 150.0000 mg | ORAL_TABLET | Freq: Every day | ORAL | Status: DC

## 2016-03-03 MED ORDER — HYDROXYZINE PAMOATE 50 MG PO CAPS: 50.0000 mg | ORAL_CAPSULE | Freq: Every evening | ORAL | Status: DC | PRN
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
## 2016-03-06 ENCOUNTER — Ambulatory Visit (INDEPENDENT_AMBULATORY_CARE_PROVIDER_SITE_OTHER): Payer: 59 | Admitting: Psychology

## 2016-03-06 DIAGNOSIS — F310 Bipolar disorder, current episode hypomanic: Secondary | ICD-10-CM

## 2016-03-06 NOTE — Progress Notes (Signed)
° °  THERAPIST PROGRESS NOTE  Session Time: 11.02am-11.55am  Participation Level: Active  Behavioral Response: Well GroomedAlertAnxious  Type of Therapy: Individual Therapy  Treatment Goals addressed: Diagnosis: bipolar D/O  Interventions: CBT and Supportive  Summary: Edinson Sundy is a 25 y.o. male who presents with increased motor activity- pt was restless in session.  Pt appeared tired initially- pt reported to bed at 4am so still waking up.  Pt reports that he is not feeling depressed and no SI.  Pt reported that if anything he feels really positive and more elated. Pt also reports that he is feeling anxious.  Pt discussed hx of what to aware of that may indicate becoming manic- pt reported splurging- usually indicator.  Pt also reports that all emotions just feel more heightened.  Pt did report that he is very restless today as feels anxious and was able to increase awareness that his thought patterns are leading to feel anxious- worrying about things can't impact or are unrealistic expectations.  Pt participated in grounding/mindfull activity using senses and agrees to practice.  Suicidal/Homicidal: Nowithout intent/plan  Therapist Response: Assessed pt current functioning per pt report.  Processed w/pt current mood and discussed w/pt indicators of manic episodes for self.  Reflected to pt restless energy and how to use movement for soothing.  Processed w/pt anxiety- assisted in understanding thought patterns impacting anxiety and reframing.  Led in grouding activity and how to practice for self.  Plan: Return again in 2 weeks.  Diagnosis: Bipolar 1 d/O    Haydin Calandra, LPC 03/06/2016

## 2016-03-19 ENCOUNTER — Ambulatory Visit (HOSPITAL_COMMUNITY): Payer: Self-pay | Admitting: Psychology

## 2016-03-21 ENCOUNTER — Emergency Department (HOSPITAL_BASED_OUTPATIENT_CLINIC_OR_DEPARTMENT_OTHER)
Admission: EM | Admit: 2016-03-21 | Discharge: 2016-03-21 | Disposition: A | Discharge status: Home / Self Care | Payer: 59 | Attending: Emergency Medicine | Admitting: Emergency Medicine

## 2016-03-21 DIAGNOSIS — F319 Bipolar disorder, unspecified: Secondary | ICD-10-CM | POA: Diagnosis not present

## 2016-03-21 DIAGNOSIS — R1084 Generalized abdominal pain: Secondary | ICD-10-CM | POA: Diagnosis present

## 2016-03-21 LAB — CBC WITH DIFFERENTIAL/PLATELET
BASOS ABS: 0 10*3/uL (ref 0.0–0.1)
Basophils Relative: 0 %
Eosinophils Absolute: 0.2 10*3/uL (ref 0.0–0.7)
Eosinophils Relative: 3 %
HEMATOCRIT: 41.8 % (ref 39.0–52.0)
Hemoglobin: 14.2 g/dL (ref 13.0–17.0)
LYMPHS PCT: 32 %
Lymphs Abs: 2.6 10*3/uL (ref 0.7–4.0)
MCH: 31.6 pg (ref 26.0–34.0)
MCHC: 34 g/dL (ref 30.0–36.0)
MCV: 93.1 fL (ref 78.0–100.0)
Monocytes Absolute: 0.8 10*3/uL (ref 0.1–1.0)
Monocytes Relative: 9 %
NEUTROS ABS: 4.5 10*3/uL (ref 1.7–7.7)
Neutrophils Relative %: 56 %
Platelets: 205 10*3/uL (ref 150–400)
RBC: 4.49 MIL/uL (ref 4.22–5.81)
RDW: 11.8 % (ref 11.5–15.5)
WBC: 8.2 10*3/uL (ref 4.0–10.5)

## 2016-03-21 LAB — COMPREHENSIVE METABOLIC PANEL
ALT: 49 U/L (ref 17–63)
AST: 24 U/L (ref 15–41)
Albumin: 4 g/dL (ref 3.5–5.0)
Alkaline Phosphatase: 39 U/L (ref 38–126)
Anion gap: 3 — ABNORMAL LOW (ref 5–15)
BUN: 12 mg/dL (ref 6–20)
CHLORIDE: 108 mmol/L (ref 101–111)
CO2: 25 mmol/L (ref 22–32)
Calcium: 9.1 mg/dL (ref 8.9–10.3)
Creatinine, Ser: 0.97 mg/dL (ref 0.61–1.24)
Glucose, Bld: 99 mg/dL (ref 65–99)
POTASSIUM: 3.8 mmol/L (ref 3.5–5.1)
Sodium: 136 mmol/L (ref 135–145)
Total Bilirubin: 0.9 mg/dL (ref 0.3–1.2)
Total Protein: 7.6 g/dL (ref 6.5–8.1)

## 2016-03-21 LAB — URINALYSIS, ROUTINE W REFLEX MICROSCOPIC
Bilirubin Urine: NEGATIVE
Glucose, UA: NEGATIVE mg/dL
Ketones, ur: NEGATIVE mg/dL
LEUKOCYTES UA: NEGATIVE
Nitrite: NEGATIVE
PH: 6 (ref 5.0–8.0)
Protein, ur: NEGATIVE mg/dL
SPECIFIC GRAVITY, URINE: 1.025 (ref 1.005–1.030)

## 2016-03-21 LAB — URINE MICROSCOPIC-ADD ON

## 2016-03-21 LAB — OCCULT BLOOD X 1 CARD TO LAB, STOOL: Fecal Occult Bld: NEGATIVE

## 2016-03-21 LAB — LIPASE, BLOOD: LIPASE: 15 U/L (ref 11–51)

## 2016-03-21 NOTE — ED Provider Notes (Signed)
CSN: [id number]     Arrival date & time 03/21/16  0710 History   First MD Initiated Contact with Patient 03/21/16 0750     Chief Complaint  Patient presents with   Abdominal Pain     (Consider location/radiation/quality/duration/timing/severity/associated sxs/prior Treatment) HPI Patient has had abdominal pain since yesterday. He reports it was worse yesterday than it is today. It is central and suprapubic. He reports it's a deep aching. He reports he had to strain to have bowel movement yesterday. He reports it was painful. Patient had had diarrhea approximately 3 weeks ago that tested positive for Giardia and enteroaggregative Escherichia coli. No vomiting with this episode of pain. Mild nausea. Slight discomfort with urination. Occasional, mild aching in testicles. Patient is sexually active.   Social history: No tobacco or alcohol use, positive sexual activity with intermittent rectal intercourse. Past Medical History  Diagnosis Date   Bipolar affective disorder ([place])    Bipolar 1 disorder ([place])    No past surgical history on file. Family History  Problem Relation Age of Onset   Hypertension Father    Schizophrenia Brother    Alcohol abuse Sister    Social History  Substance Use Topics   Smoking status: Never Smoker    Smokeless tobacco: Not on file   Alcohol Use: Yes     Comment: 4x a month    Review of Systems 10 Systems reviewed and are negative for acute change except as noted in the HPI.   Allergies  Review of patient's allergies indicates no known allergies.  Home Medications   Prior to Admission medications   Medication Sig Start Date End Date Taking? Authorizing Provider  bismuth subsalicylate (PEPTO BISMOL) 262 MG chewable tablet Chew 524 mg by mouth as needed for indigestion or diarrhea or loose stools.    Historical Provider, MD  hydrOXYzine (VISTARIL) 50 MG capsule Take 1 capsule (50 mg total) by mouth at bedtime as needed for anxiety. 03/03/16    Kathlee Nations, MD  Lactobacillus (LACTINEX) PACK Mix 1/2 packet with soft food and take twice a day for 5 days. 02/26/16   Antonietta Breach, PA-C  lamoTRIgine (LAMICTAL) 150 MG tablet Take 1 tablet (150 mg total) by mouth daily. 03/03/16   Kathlee Nations, MD  promethazine (PHENERGAN) 25 MG tablet Take 1 tablet (25 mg total) by mouth every 6 (six) hours as needed for nausea or vomiting. 02/26/16   Antonietta Breach, PA-C   BP 152/92 mmHg   Pulse 73   Temp(Src) 98.2 F (36.8 C) (Oral)   Resp 18   Ht 6' (1.829 m)   Wt 250 lb (113.399 kg)   BMI 33.90 kg/m2   SpO2 99% Physical Exam  Constitutional: He is oriented to person, place, and time. He appears well-developed and well-nourished.  HENT:  Head: Normocephalic and atraumatic.  Eyes: EOM are normal. Pupils are equal, round, and reactive to light.  Neck: Neck supple.  Cardiovascular: Normal rate, regular rhythm, normal heart sounds and intact distal pulses.   Pulmonary/Chest: Effort normal and breath sounds normal.  Abdominal: Soft. Bowel sounds are normal. He exhibits no distension. There is tenderness.  Diffuse abdominal pain without guarding. Intensifies more centrally and. Umbilical. No palpable mass. No mass within the inguinal canals.  Genitourinary:  Penis normal. Testicles are smooth and nontender. No scrotal edema. Rectal examination, prostate nontender. Thin yellow brown stool in vault.  Musculoskeletal: Normal range of motion. He exhibits no edema or tenderness.  Neurological: He is alert and  oriented to person, place, and time. He has normal strength. Coordination normal. GCS eye subscore is 4. GCS verbal subscore is 5. GCS motor subscore is 6.  Skin: Skin is warm, dry and intact.  Psychiatric: He has a normal mood and affect.    ED Course  Procedures (including critical care time) Labs Review Labs Reviewed  COMPREHENSIVE METABOLIC PANEL - Abnormal; Notable for the following:    Anion gap 3 (*)    All other components within normal limits   URINALYSIS, ROUTINE W REFLEX MICROSCOPIC (NOT AT [facility]) - Abnormal; Notable for the following:    Hgb urine dipstick SMALL (*)    All other components within normal limits  URINE MICROSCOPIC-ADD ON - Abnormal; Notable for the following:    Squamous Epithelial / LPF 0-5 (*)    Bacteria, UA FEW (*)    Casts HYALINE CASTS (*)    All other components within normal limits  LIPASE, BLOOD  CBC WITH DIFFERENTIAL/PLATELET  OCCULT BLOOD X 1 CARD TO LAB, STOOL  RPR  HIV ANTIBODY (ROUTINE TESTING)  GC/CHLAMYDIA PROBE AMP ([place]) NOT AT [facility]    Imaging Review No results found. I have personally reviewed and evaluated these images and lab results as part of my medical decision-making.   EKG Interpretation None      MDM   Final diagnoses:  Generalized abdominal pain   The patient had generalized abdominal pain. His abdominal exam is nonsurgical. At this time diagnostic workup is within normal limits. Patient did have positive Giardia several weeks ago. He was treated. He is not having diarrheal stool. At this time, suggestions are made for a lactose free diet, bland diet with low fat. Patient is counseled on the need to return should he develop worsening or changing pattern and his pain, new symptoms or fever. Urinalysis is negative. Prostate is nontender. There will be pending GC, chlamydia which patient is aware of. At this time I do not intend to treat empirically as symptoms are not localizing or suggesting STD.    Charlesetta Shanks, MD 03/21/16 1045

## 2016-03-21 NOTE — ED Notes (Signed)
Onset yesterday with suprapubic pain radiating to scrotum. +constipation. No urinary symptoms.

## 2016-03-21 NOTE — Discharge Instructions (Signed)
Abdominal Pain, Adult Sometimes after having had a diarrheal infection with Giardia, people can develop lactose intolerance or abdominal pain with intestinal inflammation. At this time, your labs are normal. Follow instructions for lactose intolerance, eat a very low fat diet and follow-up with your doctor within the next 2-3 days. If you develop a fever, your symptoms are worsening or new symptoms or developing, return to the emergency department for recheck. Many things can cause abdominal pain. Usually, abdominal pain is not caused by a disease and will improve without treatment. It can often be observed and treated at home. Your health care provider will do a physical exam and possibly order blood tests and X-rays to help determine the seriousness of your pain. However, in many cases, more time must pass before a clear cause of the pain can be found. Before that point, your health care provider may not know if you need more testing or further treatment. HOME CARE INSTRUCTIONS Monitor your abdominal pain for any changes. The following actions may help to alleviate any discomfort you are experiencing:  Only take over-the-counter or prescription medicines as directed by your health care provider.  Do not take laxatives unless directed to do so by your health care provider.  Try a clear liquid diet (broth, tea, or water) as directed by your health care provider. Slowly move to a bland diet as tolerated. SEEK MEDICAL CARE IF:  You have unexplained abdominal pain.  You have abdominal pain associated with nausea or diarrhea.  You have pain when you urinate or have a bowel movement.  You experience abdominal pain that wakes you in the night.  You have abdominal pain that is worsened or improved by eating food.  You have abdominal pain that is worsened with eating fatty foods.  You have a fever. SEEK IMMEDIATE MEDICAL CARE IF:  Your pain does not go away within 2 hours.  You keep throwing up  (vomiting).  Your pain is felt only in portions of the abdomen, such as the right side or the left lower portion of the abdomen.  You pass bloody or black tarry stools. MAKE SURE YOU:  Understand these instructions.  Will watch your condition.  Will get help right away if you are not doing well or get worse.   This information is not intended to replace advice given to you by your health care provider. Make sure you discuss any questions you have with your health care provider.   Document Released: 08/20/2005 Document Revised: 08/01/2015 Document Reviewed: 07/20/2013 Elsevier Interactive Patient Education 2016 [place]. Diet for Lactose Intolerance, Adult Lactose intolerance is when the body is not able to digest lactose, a natural sugar found in milk and milk products. If you are lactose intolerant, you should avoid consuming food and drinks with lactose.  WHAT DO I NEED TO KNOW ABOUT THIS DIET?  Avoid consuming foods and beverages with lactose.  Look for the words "lactose-free" or "lactose-reduced" on food labels. You can have lactose-free foods and may be able to have small amounts of lactose-reduced foods.  Make sure you get enough nutrients in your diet. People on this diet sometimes have trouble getting enough calcium, riboflavin, and vitamin D. Take supplements if directed by your health care provider. Talk to your health care provider about supplements if you are not taking any. WHICH FOODS HAVE LACTOSE? Lactose is found in milk and milk products, such as:   Yogurt.   Cheese.  Butter.   Margarine.  Sour cream.  Creamer.   Whipped toppings and nondairy creamers.  Ice cream and other milk-based desserts. Lactose is also found in foods made with milk or milk ingredients. To find out whether a food is made with milk or a milk ingredient, look at the ingredients list. Avoid foods with the statement "May contain milk" and foods that contain:   Butter.    Cream.  Milk.  Milk solids.  Milk powder.   Whey.  Curd.  Caseinate.  Lactose. WHAT ARE SOME ALTERNATIVES TO MILK AND FOODS MADE WITH MILK PRODUCTS?  Lactose-free products, such as lactose-free milk.  Almond or rice milk.  Soy products, such as soy yogurt, soy cheese, soy ice cream, soy-based sour cream, and soy-based infant formula.  Nondairy products, such as nondairy creamers and nondairy whipped topping. Note that nondairy products sometimes contain lactose, so it is important to check the ingredients list. CAN I HAVE ANY FOODS WITH LACTOSE? Some people with lactose intolerance can safely eat foods that have a little lactose. Foods with a little lactose have less than 1 g of lactose per serving. Examples of foods with a little lactose are:   Aged cheese (such as Swiss, cheddar, or Parmesan cheese). One serving is about 1-2 oz.  Cream cheese. One serving is about 2 Tbsp.  Ricotta cheese. One serving is about  cup. If you decide to try a food that has lactose:   Eat only one food with lactose in it at a time.  Eat only a small amount of the food.  Stop eating the food if your symptoms return. Some dairy products that are more likely than others to be tolerated include:  Cheese, especially if it is aged.  Cultured dairy products, such as yogurt, buttermilk, cottage cheese, and kefir. The healthy bacteria in these products help digest lactose.  Lactose-hydrolyzed milk. This product contains 40-90% less lactose than milk. AM I GETTING ENOUGH CALCIUM? Calcium is found in many foods with lactose and is important for bone health. The amount of calcium you need depends on your age:   Adults younger than 50 years need 1000 mg of calcium a day.  Adults older than 50 years need 1200 mg of calcium a day. Make sure you get enough calcium by taking a calcium supplement or by eating lactose-free foods that are high in calcium, such as:   Almonds,  cup (95  mg).  Broccoli, cooked, 1 cup (60 mg).  Calcium-fortified breakfast cereals, 1 cup [phone] mg).  Calcium-fortified rice or almond milk, 1 cup (300 mg).  Calcium-fortified soy milk, 1 cup (300-400 mg).  Canned salmon with edible bones, 3 oz (180 mg).  Collard greens, cooked,  cup (125 mg).  Edamame, cooked,  cup (125 mg).  Kale, frozen or cooked,  cup (90 mg).  Orange juice with calcium added, 1 cup (300-350 mg).  Sardines with edible bones, 3 oz (325 mg).  Spinach, cooked,  cup (145 mg).  Tofu set with calcium sulfate,  cup (250 mg).   This information is not intended to replace advice given to you by your health care provider. Make sure you discuss any questions you have with your health care provider.   Document Released: 06/07/2014 Document Reviewed: 06/07/2014 Elsevier Interactive Patient Education [employer].

## 2016-03-22 LAB — RPR: RPR: NONREACTIVE

## 2016-03-22 LAB — HIV ANTIBODY (ROUTINE TESTING W REFLEX): HIV SCREEN 4TH GENERATION: NONREACTIVE

## 2016-03-24 LAB — GC/CHLAMYDIA PROBE AMP (~~LOC~~) NOT AT ARMC
CHLAMYDIA, DNA PROBE: NEGATIVE
NEISSERIA GONORRHEA: NEGATIVE

## 2016-04-23 ENCOUNTER — Ambulatory Visit (INDEPENDENT_AMBULATORY_CARE_PROVIDER_SITE_OTHER): Payer: 59 | Admitting: Psychology

## 2016-04-23 DIAGNOSIS — F310 Bipolar disorder, current episode hypomanic: Secondary | ICD-10-CM | POA: Diagnosis not present

## 2016-04-23 NOTE — Progress Notes (Signed)
° °  THERAPIST PROGRESS NOTE  Session Time: 3.30pm-4.20pm  Participation Level: Active  Behavioral Response: Well GroomedAlertAnxious  Type of Therapy: Individual Therapy  Treatment Goals addressed: Diagnosis: bipolar d/O and goal 1.  Interventions: CBT and Strength-based  Summary: Jonathan White is a 25 y.o. male who presents with some fidgetiness.  Pt reported that his partner was accepted to [employer] grad school and they will be moving to [place], [place] at the end of July 2017.  Pt reported that he is worried about the transition- first major move outside of the community and to an unfamiliar community.  Pt recognizes that will take some time to get settled, find job and explore and create ties.  Pt reports he is trying to not get negative focused and recognize positives in this move.  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 Pt discussed that he really enjoyed vacation w/ partner's family and that was good to be able to fish w/ partner's dad and be totally present in that experience.  Pt identified that playing guitar can be very relaxing and enjoyable to and wants to focus on doing this for self more.     Suicidal/Homicidal: Nowithout intent/plan  Therapist Response: Assessed pt current functioning per pt report.  Processed w/pt upcoming transition w/ move and how to focus on things that helpful in connecting to area and keeping connections to friends and not ruminating on worries, creating worst case scenarios,etc.  Discussed self care w/ playing guitar and other activities he can be present to in the moment and enjoy.   Plan: Return again in 2 weeks.  Diagnosis: Bipolar 1 D/O    Jan Fireman, [facility] 04/23/2016

## 2016-05-05 ENCOUNTER — Encounter (HOSPITAL_COMMUNITY): Payer: Self-pay | Admitting: Psychiatry

## 2016-05-05 ENCOUNTER — Ambulatory Visit (INDEPENDENT_AMBULATORY_CARE_PROVIDER_SITE_OTHER): Payer: 59 | Admitting: Psychiatry

## 2016-05-05 VITALS — BP 130/98 | HR 74 | Ht 72.0 in | Wt 247.4 lb

## 2016-05-05 DIAGNOSIS — F3112 Bipolar disorder, current episode manic without psychotic features, moderate: Secondary | ICD-10-CM | POA: Diagnosis not present

## 2016-05-05 DIAGNOSIS — F1210 Cannabis abuse, uncomplicated: Secondary | ICD-10-CM | POA: Diagnosis not present

## 2016-05-05 MED ORDER — LAMOTRIGINE 150 MG PO TABS: 150.0000 mg | ORAL_TABLET | Freq: Every day | ORAL | Status: DC

## 2016-05-05 MED ORDER — HYDROXYZINE PAMOATE 50 MG PO CAPS: 50.0000 mg | ORAL_CAPSULE | Freq: Every evening | ORAL | Status: DC | PRN
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
## 2016-05-06 ENCOUNTER — Ambulatory Visit (INDEPENDENT_AMBULATORY_CARE_PROVIDER_SITE_OTHER): Payer: 59 | Admitting: Psychology

## 2016-05-06 DIAGNOSIS — F3132 Bipolar disorder, current episode depressed, moderate: Secondary | ICD-10-CM | POA: Diagnosis not present

## 2016-05-06 NOTE — Progress Notes (Signed)
° °  THERAPIST PROGRESS NOTE  Session Time: 11.03am-11.55am  Participation Level: Active  Behavioral Response: Well GroomedAlertDepressed  Type of Therapy: Individual Therapy  Treatment Goals addressed: Diagnosis: Bipolar 1 D/O and goal 1  Interventions: CBT and Strength-based  Summary: Jonathan White is a 25 y.o. male who presents with affect slightly depressed.  Pt reported that he has been struggling w/ depressed mood and is able to identify contributing factors and thoughts.  Pt reported that grief of ex has reemerged recently and recognizes that might be related to feeling sense of loss w/ upcoming move.  Pt was able to identify that he has coped through this in past and maintained relationship important and built new relationships as well. Pt is worried about his transition and coping through change.  Pt identifies also that needs to be more consistent w/ meds and set a reminder in session on his phone.  Pt discussed poor focus and follow through on things. Pt reported on things he is looking forward to this week and engaging socially.   Suicidal/Homicidal: Nowithout intent/plan  Therapist Response: Assessed pt current functioning per pt report.  Processed w/pt his depressed mood and assisted w/ identify contributing factors and challenging distortions.  Discussed pt hx of coping through transitions and that has been able to build relationships and have supports.  Prompting pt to set alarm on phone to assist w/ reminder for meds daily.  enocuraged continued social interactions and setting small daily goals for self that don't require significant organization or concentration.    Plan: Return again in 2 weeks.  Diagnosis: Bipolar 1, depressed    Ruvim Risko, LPC 05/06/2016

## 2016-05-19 ENCOUNTER — Ambulatory Visit (HOSPITAL_COMMUNITY): Payer: Self-pay | Admitting: Psychology

## 2016-05-22 ENCOUNTER — Ambulatory Visit (INDEPENDENT_AMBULATORY_CARE_PROVIDER_SITE_OTHER): Payer: 59 | Admitting: Physician Assistant

## 2016-05-22 VITALS — BP 124/86 | HR 71 | Temp 98.6°F | Resp 18 | Ht 72.0 in | Wt 246.6 lb

## 2016-05-22 DIAGNOSIS — J029 Acute pharyngitis, unspecified: Secondary | ICD-10-CM | POA: Diagnosis not present

## 2016-05-22 DIAGNOSIS — Z113 Encounter for screening for infections with a predominantly sexual mode of transmission: Secondary | ICD-10-CM | POA: Diagnosis not present

## 2016-05-22 LAB — POCT RAPID STREP A (OFFICE): RAPID STREP A SCREEN: NEGATIVE

## 2016-05-22 MED ORDER — AMOXICILLIN 875 MG PO TABS: 875.0000 mg | ORAL_TABLET | Freq: Two times a day (BID) | ORAL | Status: DC

## 2016-05-22 NOTE — Progress Notes (Signed)
05/22/2016 2:40 PM   DOB: 02-26-1991 / MRN: [id number]  SUBJECTIVE:  Jonathan White is a 25 y.o. male presenting for sore throat, fever and night sweats x 3 days.  He feels that he is getting worse.  Has tried OTC antipyretics with good relief of fever.  He does report sick contacts.    He would like to be screened for STD.  Reports he is sexually active with his husband however dose have occasional sex with other men but uses condoms. He denies any known exposure.   He has No Known Allergies.   He  has a past medical history of Bipolar affective disorder ([place]); Bipolar 1 disorder ([place]); and Depression.    He  reports that he has never smoked. He does not have any smokeless tobacco history on file. He reports that he drinks alcohol. He reports that he uses illicit drugs (Marijuana). He  reports that he currently engages in sexual activity and has had male partners. He reports using the following method of birth control/protection: Condom. The patient  has no past surgical history on file.  His family history includes Alcohol abuse in his sister; Hypertension in his father; Mental illness in his brother and sister; Schizophrenia in his brother; Stroke in his father.  Review of Systems  Constitutional: Negative for fever and chills.  Eyes: Negative for blurred vision.  Respiratory: Negative for cough and shortness of breath.   Cardiovascular: Negative for chest pain.  Gastrointestinal: Negative for nausea and abdominal pain.  Genitourinary: Negative for dysuria, urgency and frequency.  Musculoskeletal: Negative for myalgias.  Skin: Negative for rash.  Neurological: Negative for dizziness, tingling and headaches.  Psychiatric/Behavioral: Negative for depression. The patient is not nervous/anxious.     Problem list and medications reviewed and updated by myself where necessary, and exist elsewhere in the encounter.   OBJECTIVE:  BP 124/86 mmHg   Pulse 71   Temp(Src) 98.6 F (37 C)  (Oral)   Resp 18   Ht 6' (1.829 m)   Wt 246 lb 9.6 oz (111.857 kg)   BMI 33.44 kg/m2   SpO2 98%  Physical Exam  Constitutional: He is oriented to person, place, and time. He appears well-developed. He does not appear ill.  HENT:  Right Ear: Tympanic membrane normal.  Left Ear: Tympanic membrane normal.  Nose: Nose normal.  Mouth/Throat: Uvula is midline, oropharynx is clear and moist and mucous membranes are normal.  Eyes: Conjunctivae and EOM are normal. Pupils are equal, round, and reactive to light.  Cardiovascular: Normal rate and regular rhythm.   Pulmonary/Chest: Effort normal and breath sounds normal.  Abdominal: He exhibits no distension.  Musculoskeletal: Normal range of motion.  Neurological: He is alert and oriented to person, place, and time. No cranial nerve deficit. Coordination normal.  Skin: Skin is warm and dry. He is not diaphoretic.  Psychiatric: He has a normal mood and affect.  Nursing note and vitals reviewed.  No results found for: HIV1X2  No results found for: RPR  Results for orders placed or performed in visit on 05/22/16 (from the past 72 hour(s))  POCT rapid strep A     Status: None   Collection Time: 05/22/16  2:33 PM  Result Value Ref Range   Rapid Strep A Screen Negative Negative    No results found.  ASSESSMENT AND PLAN  Hayward was seen today for fever, night sweats and sore throat.  Diagnoses and all orders for this visit:  Sore throat: This appears  to be viral.  Will culture. Given that he is complaining of sore throat and no other symptoms advised that he may fill abx or wait until culture results before filling.  He is amenable to the plan.  Advised he continue Ibuprofen.  -     POCT rapid strep A -     Culture, Group A Strep  Routine screening for STI (sexually transmitted infection): MSM requesting routine testing.  -     HIV antibody (with reflex) -     RPR -     GC/Chlamydia Probe Amp   The patient was advised to call or  return to clinic if he does not see an improvement in symptoms or to seek the care of the closest emergency department if he worsens with the above plan.   Philis Fendt, MHS, PA-C [facility] 05/22/2016 2:40 PM

## 2016-05-22 NOTE — Patient Instructions (Addendum)
Given your what you tell me about this illness it is no unreasonable to start and antibiotic while we wait for the culture, however it is also okay to wait until the culture results before filling.  I would personally wait and avoid side effects as there is a strong possibility this is viral, however I will leave this up to you for now. I will contact you with culture results. Please continue taking Ibuprofen for control pain and fever.     IF you received an x-ray today, you will receive an invoice from [facility]. Please contact [facility] at [phone] with questions or concerns regarding your invoice.   IF you received labwork today, you will receive an invoice from [employer]. Please contact [facility] at [phone] with questions or concerns regarding your invoice.   Our billing staff will not be able to assist you with questions regarding bills from these companies.  You will be contacted with the lab results as soon as they are available. The fastest way to get your results is to activate your My Chart account. Instructions are located on the last page of this paperwork. If you have not heard from [place] regarding the results in 2 weeks, please contact this office.

## 2016-05-23 ENCOUNTER — Ambulatory Visit (HOSPITAL_COMMUNITY): Payer: Self-pay | Admitting: Psychology

## 2016-05-23 LAB — GC/CHLAMYDIA PROBE AMP
CT Probe RNA: NOT DETECTED
GC Probe RNA: NOT DETECTED

## 2016-05-23 LAB — RPR

## 2016-05-23 LAB — HIV ANTIBODY (ROUTINE TESTING W REFLEX): HIV: NONREACTIVE

## 2016-05-24 ENCOUNTER — Encounter: Payer: Self-pay | Admitting: *Deleted

## 2016-05-24 LAB — CULTURE, GROUP A STREP: Organism ID, Bacteria: NORMAL

## 2016-06-02 ENCOUNTER — Ambulatory Visit (INDEPENDENT_AMBULATORY_CARE_PROVIDER_SITE_OTHER): Payer: 59 | Admitting: Psychology

## 2016-06-02 DIAGNOSIS — F3132 Bipolar disorder, current episode depressed, moderate: Secondary | ICD-10-CM

## 2016-06-02 NOTE — Progress Notes (Signed)
° °  THERAPIST PROGRESS NOTE  Session Time: 1.45pm-2.30pm  Participation Level: Active  Behavioral Response: Well GroomedAlertDepressed  Type of Therapy: Individual Therapy  Treatment Goals addressed: Diagnosis: Bipolar 1 D/O and goal 1  Interventions: CBT and Supportive  Summary: Jonathan White is a 25 y.o. male who presents with depressed mood and affect.  Pt reported that over the past week or 2 feeling more depressed.  Pt reported that he is feeling anxious about the move- transition to becoming the sole provider and that he will fail at doing this.  Pt was able to identify unhealthy thought patterns and how to challenge and redirect thoughts to steps he is taking and options they have moving forward. Pt shared that he was considering inpt tx last week but ended up reaching out to a friend and found this helpful.  Pt reported he was having thoughts of not being a good person and was thinking about cutting but not to end his life.  Pt reports he didn't cut or harm self in any way and wasn't planning or any intent.  Pt was able to identify that focus on past mistakes and choices criticizing self for and not acknowledging evidence against statement of being bad person- pt was able to gain insight and challenge in session.   Suicidal/Homicidal: Nowithout intent/plan  Therapist Response: Assessed pt current functioning per pt report.  Processed w/pt his mood and increased depressed mood and assisted in identifying contributing thought patterns.  Assisted pt in challenging and reframing.  Evaluated for safety discussed pt positive use of supports and how this has been beneficial to his coping.  Encouraged continued healthy coping skills.   Plan: Return again in 2 weeks. Pt to call if needed prior or seek crisis services if SI w/ intent or plan.   Diagnosis: Bipolar 1 d/o, depressed.     Jan Fireman, [facility] 06/02/2016

## 2016-06-16 ENCOUNTER — Ambulatory Visit (INDEPENDENT_AMBULATORY_CARE_PROVIDER_SITE_OTHER): Payer: 59 | Admitting: Psychology

## 2016-06-16 DIAGNOSIS — F3131 Bipolar disorder, current episode depressed, mild: Secondary | ICD-10-CM | POA: Diagnosis not present

## 2016-06-17 NOTE — Progress Notes (Signed)
° °  THERAPIST PROGRESS NOTE  Session Time: 1.40pm-2.30pm  Participation Level: Active  Behavioral Response: Well GroomedAlertAnxious and Depressed  Type of Therapy: Individual Therapy  Treatment Goals addressed: Diagnosis: Bipolar D/O and goal 1  Interventions: CBT and Supportive  Summary: Jonathan White is a 25 y.o. male who presents with affect congruent w/ report of some anxiety and depressed mood.  Pt reported that he is doing better w/ mood- less depressed and not ruminating on negatives or worst case scenario about upcoming move.  Pt reports that he has finished his last day of work in [place] and he and partner are scheduled to move on 06/21/16 to [place].  Pt reported that he has been staying aware of negative thought patterns and working to reframe them and seek assistance from supports when too intense.  Pt reports that he is making small daily goals and this is helpful but is still struggling w/ staying focused to stay on task.  Pt receptive to ideas shared. Pt agrees to keep routine for self to include sleep and wake schedule, dress and shower, daily relaxation activity and small accomplishable daily goal- related to chore/responsibility and engage w/ supports/friends daily during transition. Pt expressed he is in a better place then started w/ counseling and feels this will be beneficial w/ upcoming change.   Suicidal/Homicidal: Nowithout intent/plan  Therapist Response: Assessed pt current functioning per pt report.  Processed w/pt mood, validated and normalized for upcoming move and focused pt on awareness of challenging negative thought patterns and seeking supports from friends.  Discussed strategies to stay on task.  Assisted pt w/ identifying daily routine for self during while transitioning to new place.   Plan: Call as needed in next couple of months.  Pt to establish counseling in [place] area.    Diagnosis: Bipolar 1 D/O    Jan Fireman, [facility] 06/17/2016

## 2016-07-29 ENCOUNTER — Ambulatory Visit (HOSPITAL_COMMUNITY): Payer: Self-pay | Admitting: Psychiatry

## 2016-12-16 ENCOUNTER — Encounter (HOSPITAL_COMMUNITY): Payer: Self-pay | Admitting: Psychology

## 2016-12-16 NOTE — Progress Notes (Signed)
Jonathan White is a 26 y.o. male patient who is discharged from counseling as pt moved out of the area 05/2016.  Outpatient Therapist Discharge Summary  [name]    02-Dec-1990   Admission Date: 02/04/16   Discharge Date:  12/16/16 Reason for Discharge:  Pt moved out of area  Diagnosis:  Bipolar 1 D/O  Comments:  Pt last seen on 06/16/16, w/ planned last appointment and ability to return if needed in 60 days until established care in new community.  Pt didn't return for any services.  Jenne Campus, LPC

## 2017-12-25 DIAGNOSIS — R079 Chest pain, unspecified: Secondary | ICD-10-CM | POA: Insufficient documentation

## 2018-01-22 HISTORY — PX: CARDIAC CATHETERIZATION: SHX172

## 2019-02-21 ENCOUNTER — Other Ambulatory Visit: Payer: Self-pay

## 2019-02-21 ENCOUNTER — Emergency Department (HOSPITAL_COMMUNITY)
Admission: EM | Admit: 2019-02-21 | Discharge: 2019-02-21 | Disposition: A | Discharge status: Home / Self Care | Payer: BLUE CROSS/BLUE SHIELD | Attending: Emergency Medicine | Admitting: Emergency Medicine

## 2019-02-21 ENCOUNTER — Encounter (HOSPITAL_COMMUNITY): Payer: Self-pay | Admitting: *Deleted

## 2019-02-21 ENCOUNTER — Emergency Department (HOSPITAL_COMMUNITY): Payer: BLUE CROSS/BLUE SHIELD

## 2019-02-21 DIAGNOSIS — F419 Anxiety disorder, unspecified: Secondary | ICD-10-CM | POA: Insufficient documentation

## 2019-02-21 DIAGNOSIS — R0789 Other chest pain: Secondary | ICD-10-CM | POA: Insufficient documentation

## 2019-02-21 DIAGNOSIS — R079 Chest pain, unspecified: Secondary | ICD-10-CM | POA: Diagnosis not present

## 2019-02-21 DIAGNOSIS — F418 Other specified anxiety disorders: Secondary | ICD-10-CM | POA: Diagnosis not present

## 2019-02-21 DIAGNOSIS — F1290 Cannabis use, unspecified, uncomplicated: Secondary | ICD-10-CM

## 2019-02-21 DIAGNOSIS — F319 Bipolar disorder, unspecified: Secondary | ICD-10-CM | POA: Diagnosis not present

## 2019-02-21 DIAGNOSIS — Z9189 Other specified personal risk factors, not elsewhere classified: Secondary | ICD-10-CM

## 2019-02-21 DIAGNOSIS — R42 Dizziness and giddiness: Secondary | ICD-10-CM | POA: Diagnosis not present

## 2019-02-21 LAB — CBC
HEMATOCRIT: 42.5 % (ref 39.0–52.0)
HEMOGLOBIN: 14 g/dL (ref 13.0–17.0)
MCH: 31.4 pg (ref 26.0–34.0)
MCHC: 32.9 g/dL (ref 30.0–36.0)
MCV: 95.3 fL (ref 80.0–100.0)
NRBC: 0 % (ref 0.0–0.2)
Platelets: 240 10*3/uL (ref 150–400)
RBC: 4.46 MIL/uL (ref 4.22–5.81)
RDW: 11.5 % (ref 11.5–15.5)
WBC: 7.6 10*3/uL (ref 4.0–10.5)

## 2019-02-21 LAB — BASIC METABOLIC PANEL
Anion gap: 7 (ref 5–15)
BUN: 17 mg/dL (ref 6–20)
CHLORIDE: 107 mmol/L (ref 98–111)
CO2: 24 mmol/L (ref 22–32)
Calcium: 9 mg/dL (ref 8.9–10.3)
Creatinine, Ser: 1 mg/dL (ref 0.61–1.24)
GFR calc non Af Amer: >60 mL/min (ref >60)
Glucose, Bld: 110 mg/dL — ABNORMAL HIGH (ref 70–99)
POTASSIUM: 3.5 mmol/L (ref 3.5–5.1)
Sodium: 138 mmol/L (ref 135–145)

## 2019-02-21 LAB — TROPONIN I: Troponin I: <0.03 ng/mL (ref <0.03)

## 2019-02-21 LAB — RAPID URINE DRUG SCREEN, HOSP PERFORMED
Amphetamines: NOT DETECTED
Barbiturates: NOT DETECTED
Benzodiazepines: NOT DETECTED
Cocaine: NOT DETECTED
Opiates: NOT DETECTED
TETRAHYDROCANNABINOL: POSITIVE — AB

## 2019-02-21 LAB — SEDIMENTATION RATE: Sed Rate: 3 mm/hr (ref 0–16)

## 2019-02-21 LAB — C-REACTIVE PROTEIN: CRP: <0.8 mg/dL (ref <1.0)

## 2019-02-21 NOTE — ED Notes (Signed)
PT DISCHARGED. INSTRUCTIONS GIVEN. AAOX4. PT IN NO APPARENT DISTRESS OR PAIN. THE OPPORTUNITY TO ASK QUESTIONS WAS PROVIDED.

## 2019-02-21 NOTE — ED Provider Notes (Signed)
[facility] DEPT Provider Note   CSN: [id number] Arrival date & time: 02/21/19  0605    History   Chief Complaint Chief Complaint  Patient presents with   Dizziness   Chest Pain   Anxiety    HPI Jonathan White is a 28 y.o. male with history of anxiety, depression is here for evaluation of lightheadedness.  Onset approximately 1 hour ago.  He smoked marijuana 30 minutes prior to onset.  Associated with left-sided chest "tightness" that has been constant for about an hour which is nonexertional, nonpleuritic non-positional, diffuse body tingling, generalized weakness with walking.  No interventions.  No alleviating or aggravating factors.  He is a daily marijuana smoker, typically smokes every morning.  He has had the same marijuana for the last several days and has smoked it without issues.  No one else smokes marijuana with him.  Denies any other illicit drug use.  Has been getting the marijuana from the same supplier and does not think anything was added to it.  No cocaine use.  No IV drug use.  No recent illnesses.  He denies any associated fever, cough, shortness of breath, nausea, vomiting, diaphoresis, abdominal pain, changes to bowel movements, lower extremity edema or calf pain.  No history of DVT/PE.  No known CAD in relatives.  States he was diagnosed with anxiety but not taking any of his medicines.  Admits in the last week he has had "plenty" to be stressed about and admits to worsening stress at home.      HPI  Past Medical History:  Diagnosis Date   Bipolar 1 disorder ([place])    Bipolar affective disorder [facility])    Depression     Patient Active Problem List   Diagnosis Date Noted   Priapism 02/19/2011   Bipolar disorder ([place]) 02/14/2011    History reviewed. No pertinent surgical history.      Home Medications    Prior to Admission medications   Medication Sig Start Date End Date Taking? Authorizing Provider  amoxicillin  (AMOXIL) 875 MG tablet Take 1 tablet (875 mg total) by mouth 2 (two) times daily. Patient not taking: Reported on 02/21/2019 05/22/16   Tereasa Coop, PA-C  hydrOXYzine (VISTARIL) 50 MG capsule Take 1 capsule (50 mg total) by mouth at bedtime as needed for anxiety. 05/05/16   Arfeen, Arlyce Harman, MD  Lactobacillus (LACTINEX) PACK Mix 1/2 packet with soft food and take twice a day for 5 days. Patient not taking: Reported on 05/22/2016 02/26/16   Antonietta Breach, PA-C  lamoTRIgine (LAMICTAL) 150 MG tablet Take 1 tablet (150 mg total) by mouth daily. 05/05/16   Arfeen, Arlyce Harman, MD    Family History Family History  Problem Relation Age of Onset   Hypertension Father    Stroke Father    Schizophrenia Brother    Mental illness Brother    Alcohol abuse Sister    Mental illness Sister     Social History Social History   Tobacco Use   Smoking status: Never Smoker   Smokeless tobacco: Never Used  Substance Use Topics   Alcohol use: Yes    Alcohol/week: 0.0 standard drinks    Comment: 4x a month   Drug use: Yes    Frequency: 1.0 times per week    Types: Marijuana     Allergies   Patient has no known allergies.   Review of Systems Review of Systems  Respiratory: Positive for chest tightness.   Neurological: Positive for  weakness and light-headedness.       Diffuse tingling  All other systems reviewed and are negative.    Physical Exam Updated Vital Signs BP 128/79 (BP Location: Right Arm)    Pulse 83    Temp 97.7 F (36.5 C) (Oral)    Resp 20    Ht 6\' 5"  (1.956 m)    Wt 110.7 kg    SpO2 99%    BMI 28.93 kg/m   Physical Exam Constitutional:      Appearance: He is well-developed.     Comments: NAD. Non toxic.   HENT:     Head: Normocephalic and atraumatic.     Nose: Nose normal.  Eyes:     General: Lids are normal.     Conjunctiva/sclera: Conjunctivae normal.  Neck:     Musculoskeletal: Normal range of motion.     Trachea: Trachea normal.     Comments: Trachea  midline.  Cardiovascular:     Rate and Rhythm: Normal rate and regular rhythm.     Pulses:          Radial pulses are 2+ on the right side and 2+ on the left side.       Dorsalis pedis pulses are 2+ on the right side and 2+ on the left side.     Heart sounds: Normal heart sounds, S1 normal and S2 normal.     Comments: No murmurs. No LE edema or calf tenderness. No reproducibility of pain with position changes or chest palpation  Pulmonary:     Effort: Pulmonary effort is normal.     Breath sounds: Normal breath sounds.  Abdominal:     General: Bowel sounds are normal.     Palpations: Abdomen is soft.     Tenderness: There is no abdominal tenderness.     Comments: No epigastric tenderness. No distention.   Skin:    General: Skin is warm and dry.     Capillary Refill: Capillary refill takes less than 2 seconds.     Comments: No rash to chest wall  Neurological:     Mental Status: He is alert.     GCS: GCS eye subscore is 4. GCS verbal subscore is 5. GCS motor subscore is 6.  Psychiatric:        Speech: Speech normal.        Behavior: Behavior normal.        Thought Content: Thought content normal.     Comments: Appears anxious, tapping foot during exam       ED Treatments / Results  Labs (all labs ordered are listed, but only abnormal results are displayed) Labs Reviewed  BASIC METABOLIC PANEL - Abnormal; Notable for the following components:      Result Value   Glucose, Bld 110 (*)    All other components within normal limits  CBC  TROPONIN I  C-REACTIVE PROTEIN  SEDIMENTATION RATE  RAPID URINE DRUG SCREEN, HOSP PERFORMED    EKG EKG Interpretation  Date/Time:  Monday February 21 2019 06:17:13 EDT Ventricular Rate:  98 PR Interval:    QRS Duration: 83 QT Interval:  324 QTC Calculation: 414 R Axis:   55 Text Interpretation:  Sinus rhythm ST elevation suggests acute pericarditis No previous ECGs available Confirmed by Ezequiel Essex [phone]) on 02/21/2019 6:31:33 AM  Also confirmed by Ezequiel Essex [phone]), editor Philomena Doheny [phone])  on 02/21/2019 7:15:09 AM   Radiology Dg Chest 2 View  Result Date: 02/21/2019 CLINICAL DATA:  Sudden onset  chest tightness after smoking marijuana EXAM: CHEST - 2 VIEW COMPARISON:  None. FINDINGS: Normal heart size and mediastinal contours. No acute infiltrate or edema. No effusion or pneumothorax. No acute osseous findings. IMPRESSION: Negative chest. Electronically Signed   By: Monte Fantasia M.D.   On: 02/21/2019 07:32    Procedures Procedures (including critical care time)  Medications Ordered in ED Medications - No data to display   Initial Impression / Assessment and Plan / ED Course  I have reviewed the triage vital signs and the nursing notes.  Pertinent labs & imaging results that were available during my care of the patient were reviewed by me and considered in my medical decision making (see chart for details).        History and exam most suggestive of atypical CP.  Likely related to marijuana use/intoxication vs underlying anxiety vs MSK. He admits having increased worsening anxiety due to multiple issues at home, non compliant with anxiety tx.  Chest tightness is non exertional, non pleuritic, non positional.  No associated fever, cough, SOB, palpitations, symptoms of blood clot.  Cardiac risk factors including elevated BMI.  No recent viral illness, however given subtle diffuse ST elevations vs early repol EKG changes will obtain labs, EKG, inflammatory markers to evaluate for pericarditis.   3300: Labs including CRP, sed rate unremarkable. WBC normal. Undetectable troponin. Normal electrolytes and hgb.  CXR w/o edema, infiltrates, cardiomegaly, widening of mediastinum.  PERC negative. Very low suspicion for PE, PTX, dissection, symptomatic anemia.  Adamantly denies use of cocaine. Likely inhalation related symptoms vs anxiety vs MSK. Encouraged PCP f/u. Encouraged cessation of illicit drug use. Return  precautions given.  Final Clinical Impressions(s) / ED Diagnoses     Lateef Juncaj was evaluated in Emergency Department on 02/21/2019 for the symptoms described in the history of present illness. He was evaluated in the context of the global COVID-19 pandemic, which necessitated consideration that the patient might be at risk for infection with the SARS-CoV-2 virus that causes COVID-19. Institutional protocols and algorithms that pertain to the evaluation of patients at risk for COVID-19 are in a state of rapid change based on information released by regulatory bodies including the CDC and federal and state organizations. These policies and algorithms were followed during the patient's care in the ED. Final diagnoses:  Atypical chest pain  Marijuana use  At risk for increased anxiety    ED Discharge Orders    None       Arlean Hopping 02/21/19 [id number]    Quintella Reichert, MD 02/21/19 [phone]

## 2019-02-21 NOTE — Discharge Instructions (Addendum)
You were evaluated in the emergency department for chest discomfort, weakness, light-headedness, tingling after marijuana use. I suspect this may be related to marijuana intoxication or possibly underlying worsening anxiety or muscle pain.  Based on your risk factors, work up and exam you are considered low risk for major adverse cardiac events in the next 30 days.  This is probably not your heart. This means you can be discharged with close follow up with cardiology for further outpatient work up.   You need to follow up with your primary care doctor soon to discuss underlying worsening anxiety and determine if you need more testing. Try to stop use of illicit drugs.   Please return to ED if: Your chest pain is worse or on exertion You have a cough that gets worse, or you cough up blood. You have severe pain in chest, back or abdomen. You have chest pain or shortness of breath with exertion or activity You have sudden, unexplained discomfort in your chest, with radiation arms, back, neck, or jaw. You suddenly have chest pain and begin to sweat, or your skin gets clammy. You feel chest pain with nausea or vomiting. You suddenly feel light-headed or faint. Your heart begins to beat quickly, or it feels like it is skipping beats. You have one sided leg swelling or calf pain

## 2019-02-21 NOTE — ED Triage Notes (Addendum)
Pt c/o dizziness with SOB x 1 hour.  Pt denies n/v/diaphoresis.  Pt is anxious.  Pt stated "I smoked pot about 30 minutes before it started".

## 2019-04-19 DIAGNOSIS — Z114 Encounter for screening for human immunodeficiency virus [HIV]: Secondary | ICD-10-CM | POA: Diagnosis not present

## 2019-04-19 DIAGNOSIS — Z113 Encounter for screening for infections with a predominantly sexual mode of transmission: Secondary | ICD-10-CM | POA: Diagnosis not present

## 2019-04-19 DIAGNOSIS — N341 Nonspecific urethritis: Secondary | ICD-10-CM | POA: Diagnosis not present

## 2019-05-12 DIAGNOSIS — Z20828 Contact with and (suspected) exposure to other viral communicable diseases: Secondary | ICD-10-CM | POA: Diagnosis not present

## 2019-11-08 DIAGNOSIS — F331 Major depressive disorder, recurrent, moderate: Secondary | ICD-10-CM | POA: Diagnosis not present

## 2019-11-08 DIAGNOSIS — E669 Obesity, unspecified: Secondary | ICD-10-CM | POA: Diagnosis not present

## 2019-11-08 DIAGNOSIS — F5104 Psychophysiologic insomnia: Secondary | ICD-10-CM | POA: Diagnosis not present

## 2019-11-08 DIAGNOSIS — Z23 Encounter for immunization: Secondary | ICD-10-CM | POA: Diagnosis not present

## 2019-11-08 DIAGNOSIS — F419 Anxiety disorder, unspecified: Secondary | ICD-10-CM | POA: Diagnosis not present

## 2020-03-24 IMAGING — CR CHEST - 2 VIEW
2 series · 2 of 2 positions shown · non-contrast
Comparison: None.

CLINICAL DATA: Sudden onset chest tightness after smoking marijuana

EXAM:
CHEST - 2 VIEW

[w chest pa]
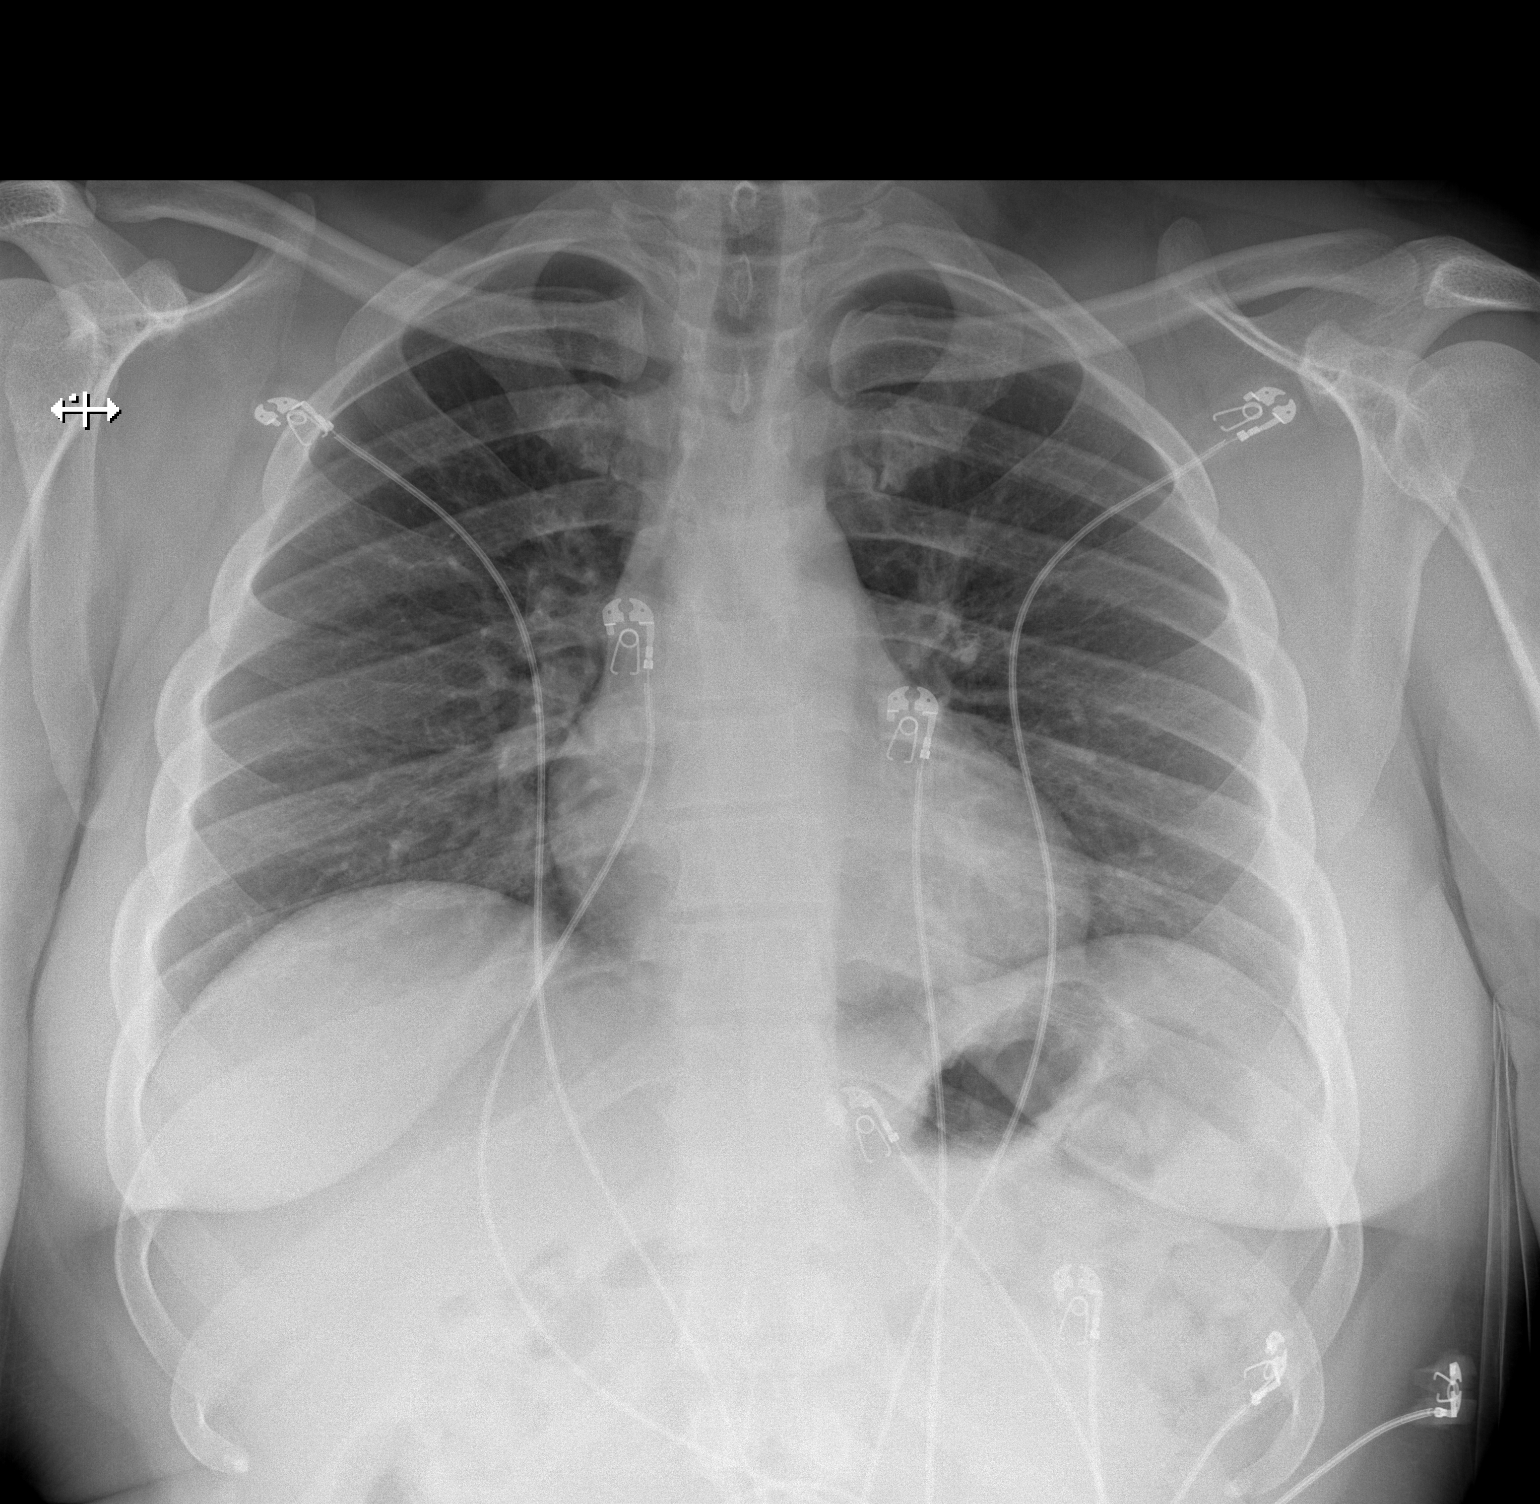

[w chest lat]
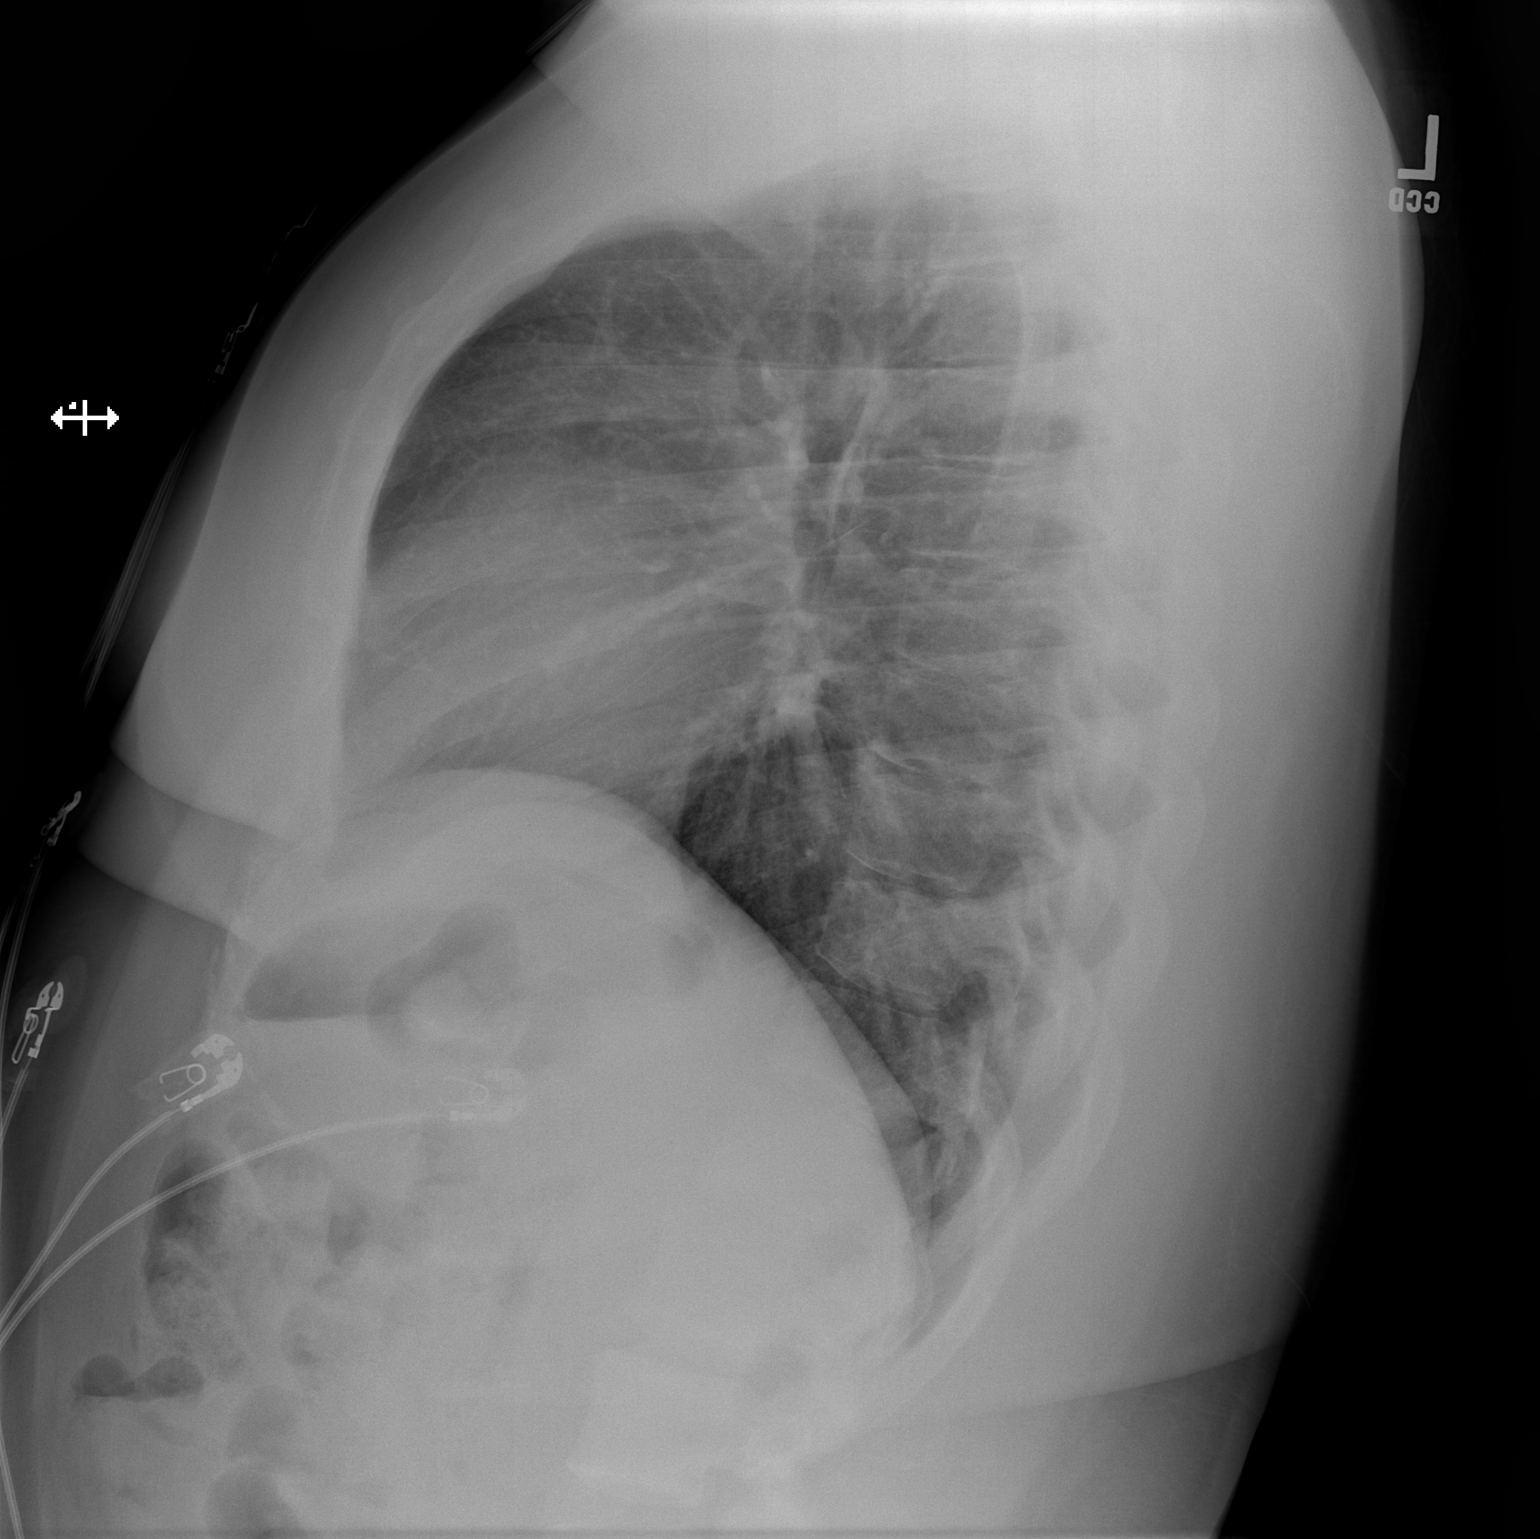

[2 of 2 positions shown; findings below may reference images not displayed]

FINDINGS: Normal heart size and mediastinal contours. No acute infiltrate or
edema. No effusion or pneumothorax. No acute osseous findings.
IMPRESSION: Negative chest.

## 2020-04-26 DIAGNOSIS — F411 Generalized anxiety disorder: Secondary | ICD-10-CM | POA: Diagnosis not present

## 2020-06-05 DIAGNOSIS — F411 Generalized anxiety disorder: Secondary | ICD-10-CM | POA: Diagnosis not present

## 2020-06-22 ENCOUNTER — Other Ambulatory Visit (HOSPITAL_COMMUNITY)
Admission: RE | Admit: 2020-06-22 | Discharge: 2020-06-22 | Disposition: A | Discharge status: Home / Self Care | Payer: BC Managed Care – PPO | Source: Ambulatory Visit | Attending: Family Medicine | Admitting: Family Medicine

## 2020-06-22 ENCOUNTER — Other Ambulatory Visit: Payer: Self-pay | Admitting: Family Medicine

## 2020-06-22 DIAGNOSIS — F319 Bipolar disorder, unspecified: Secondary | ICD-10-CM | POA: Diagnosis not present

## 2020-06-22 DIAGNOSIS — Z113 Encounter for screening for infections with a predominantly sexual mode of transmission: Secondary | ICD-10-CM | POA: Diagnosis not present

## 2020-06-22 DIAGNOSIS — R635 Abnormal weight gain: Secondary | ICD-10-CM | POA: Diagnosis not present

## 2020-06-22 DIAGNOSIS — G4730 Sleep apnea, unspecified: Secondary | ICD-10-CM | POA: Diagnosis not present

## 2020-06-22 DIAGNOSIS — I10 Essential (primary) hypertension: Secondary | ICD-10-CM | POA: Diagnosis not present

## 2020-06-22 DIAGNOSIS — L83 Acanthosis nigricans: Secondary | ICD-10-CM | POA: Diagnosis not present

## 2020-06-26 DIAGNOSIS — F411 Generalized anxiety disorder: Secondary | ICD-10-CM | POA: Diagnosis not present

## 2020-06-26 LAB — MOLECULAR ANCILLARY ONLY
Bacterial Vaginitis (gardnerella): POSITIVE — AB
Candida Glabrata: NEGATIVE
Candida Vaginitis: NEGATIVE
Chlamydia: NEGATIVE
Comment: NEGATIVE
Comment: NEGATIVE
Comment: NEGATIVE
Comment: NEGATIVE
Comment: NEGATIVE
Comment: NORMAL
Neisseria Gonorrhea: NEGATIVE
Trichomonas: NEGATIVE

## 2020-07-06 DIAGNOSIS — E8881 Metabolic syndrome: Secondary | ICD-10-CM | POA: Diagnosis not present

## 2020-07-06 DIAGNOSIS — E669 Obesity, unspecified: Secondary | ICD-10-CM | POA: Diagnosis not present

## 2020-07-06 DIAGNOSIS — I10 Essential (primary) hypertension: Secondary | ICD-10-CM | POA: Diagnosis not present

## 2020-07-12 DIAGNOSIS — F319 Bipolar disorder, unspecified: Secondary | ICD-10-CM | POA: Diagnosis not present

## 2020-07-17 DIAGNOSIS — F411 Generalized anxiety disorder: Secondary | ICD-10-CM | POA: Diagnosis not present

## 2020-07-19 DIAGNOSIS — F319 Bipolar disorder, unspecified: Secondary | ICD-10-CM | POA: Diagnosis not present

## 2020-07-31 DIAGNOSIS — F411 Generalized anxiety disorder: Secondary | ICD-10-CM | POA: Diagnosis not present

## 2020-08-09 DIAGNOSIS — F319 Bipolar disorder, unspecified: Secondary | ICD-10-CM | POA: Diagnosis not present

## 2020-08-09 DIAGNOSIS — Z0000 Encounter for general adult medical examination without abnormal findings: Secondary | ICD-10-CM | POA: Diagnosis not present

## 2020-08-09 DIAGNOSIS — E782 Mixed hyperlipidemia: Secondary | ICD-10-CM | POA: Diagnosis not present

## 2020-08-13 DIAGNOSIS — M79641 Pain in right hand: Secondary | ICD-10-CM | POA: Diagnosis not present

## 2020-08-13 DIAGNOSIS — M545 Low back pain: Secondary | ICD-10-CM | POA: Diagnosis not present

## 2020-08-14 DIAGNOSIS — F411 Generalized anxiety disorder: Secondary | ICD-10-CM | POA: Diagnosis not present

## 2020-09-06 DIAGNOSIS — F319 Bipolar disorder, unspecified: Secondary | ICD-10-CM | POA: Diagnosis not present

## 2020-09-21 DIAGNOSIS — F411 Generalized anxiety disorder: Secondary | ICD-10-CM | POA: Diagnosis not present

## 2020-10-09 DIAGNOSIS — Z206 Contact with and (suspected) exposure to human immunodeficiency virus [HIV]: Secondary | ICD-10-CM | POA: Diagnosis not present

## 2020-10-09 DIAGNOSIS — I10 Essential (primary) hypertension: Secondary | ICD-10-CM | POA: Diagnosis not present

## 2020-10-09 DIAGNOSIS — Z113 Encounter for screening for infections with a predominantly sexual mode of transmission: Secondary | ICD-10-CM | POA: Diagnosis not present

## 2020-10-10 DIAGNOSIS — Z113 Encounter for screening for infections with a predominantly sexual mode of transmission: Secondary | ICD-10-CM | POA: Diagnosis not present

## 2020-10-10 DIAGNOSIS — Z206 Contact with and (suspected) exposure to human immunodeficiency virus [HIV]: Secondary | ICD-10-CM | POA: Diagnosis not present

## 2020-10-22 ENCOUNTER — Other Ambulatory Visit: Payer: BC Managed Care – PPO

## 2020-10-22 DIAGNOSIS — Z20822 Contact with and (suspected) exposure to covid-19: Secondary | ICD-10-CM

## 2020-10-23 LAB — SARS-COV-2, NAA 2 DAY TAT

## 2020-10-23 LAB — NOVEL CORONAVIRUS, NAA: SARS-CoV-2, NAA: NOT DETECTED

## 2020-10-30 ENCOUNTER — Other Ambulatory Visit: Payer: BC Managed Care – PPO

## 2020-10-30 DIAGNOSIS — Z20822 Contact with and (suspected) exposure to covid-19: Secondary | ICD-10-CM

## 2020-10-31 LAB — SARS-COV-2, NAA 2 DAY TAT

## 2020-10-31 LAB — NOVEL CORONAVIRUS, NAA: SARS-CoV-2, NAA: NOT DETECTED

## 2020-11-12 DIAGNOSIS — F411 Generalized anxiety disorder: Secondary | ICD-10-CM | POA: Diagnosis not present

## 2020-11-13 ENCOUNTER — Other Ambulatory Visit: Payer: BC Managed Care – PPO

## 2020-11-13 DIAGNOSIS — Z20822 Contact with and (suspected) exposure to covid-19: Secondary | ICD-10-CM

## 2020-11-14 LAB — SARS-COV-2, NAA 2 DAY TAT

## 2020-11-14 LAB — NOVEL CORONAVIRUS, NAA: SARS-CoV-2, NAA: NOT DETECTED

## 2020-11-20 ENCOUNTER — Other Ambulatory Visit: Payer: BC Managed Care – PPO

## 2020-11-20 DIAGNOSIS — Z20822 Contact with and (suspected) exposure to covid-19: Secondary | ICD-10-CM

## 2020-11-21 LAB — SARS-COV-2, NAA 2 DAY TAT

## 2020-11-21 LAB — NOVEL CORONAVIRUS, NAA: SARS-CoV-2, NAA: NOT DETECTED

## 2020-11-29 DIAGNOSIS — Z03818 Encounter for observation for suspected exposure to other biological agents ruled out: Secondary | ICD-10-CM | POA: Diagnosis not present

## 2020-12-05 ENCOUNTER — Other Ambulatory Visit (HOSPITAL_COMMUNITY)
Admission: RE | Admit: 2020-12-05 | Discharge: 2020-12-05 | Disposition: A | Discharge status: Home / Self Care | Payer: BC Managed Care – PPO | Source: Ambulatory Visit | Attending: Family Medicine | Admitting: Family Medicine

## 2020-12-05 ENCOUNTER — Other Ambulatory Visit: Payer: Self-pay | Admitting: Family Medicine

## 2020-12-05 DIAGNOSIS — Z113 Encounter for screening for infections with a predominantly sexual mode of transmission: Secondary | ICD-10-CM | POA: Insufficient documentation

## 2020-12-05 DIAGNOSIS — E782 Mixed hyperlipidemia: Secondary | ICD-10-CM | POA: Diagnosis not present

## 2020-12-05 DIAGNOSIS — I10 Essential (primary) hypertension: Secondary | ICD-10-CM | POA: Diagnosis not present

## 2020-12-05 DIAGNOSIS — E669 Obesity, unspecified: Secondary | ICD-10-CM | POA: Diagnosis not present

## 2020-12-05 DIAGNOSIS — F319 Bipolar disorder, unspecified: Secondary | ICD-10-CM | POA: Diagnosis not present

## 2020-12-06 LAB — MOLECULAR ANCILLARY ONLY
Bacterial Vaginitis (gardnerella): POSITIVE — AB
Candida Glabrata: NEGATIVE
Candida Vaginitis: NEGATIVE
Chlamydia: NEGATIVE
Comment: NEGATIVE
Comment: NEGATIVE
Comment: NEGATIVE
Comment: NEGATIVE
Comment: NEGATIVE
Comment: NORMAL
Neisseria Gonorrhea: NEGATIVE
Trichomonas: NEGATIVE

## 2020-12-10 DIAGNOSIS — F411 Generalized anxiety disorder: Secondary | ICD-10-CM | POA: Diagnosis not present

## 2020-12-14 DIAGNOSIS — F4310 Post-traumatic stress disorder, unspecified: Secondary | ICD-10-CM | POA: Diagnosis not present

## 2020-12-14 DIAGNOSIS — F902 Attention-deficit hyperactivity disorder, combined type: Secondary | ICD-10-CM | POA: Diagnosis not present

## 2020-12-14 DIAGNOSIS — F330 Major depressive disorder, recurrent, mild: Secondary | ICD-10-CM | POA: Diagnosis not present

## 2020-12-14 DIAGNOSIS — F411 Generalized anxiety disorder: Secondary | ICD-10-CM | POA: Diagnosis not present

## 2020-12-20 DIAGNOSIS — F902 Attention-deficit hyperactivity disorder, combined type: Secondary | ICD-10-CM | POA: Diagnosis not present

## 2020-12-20 DIAGNOSIS — F4310 Post-traumatic stress disorder, unspecified: Secondary | ICD-10-CM | POA: Diagnosis not present

## 2020-12-20 DIAGNOSIS — F330 Major depressive disorder, recurrent, mild: Secondary | ICD-10-CM | POA: Diagnosis not present

## 2020-12-20 DIAGNOSIS — F411 Generalized anxiety disorder: Secondary | ICD-10-CM | POA: Diagnosis not present

## 2020-12-26 DIAGNOSIS — F330 Major depressive disorder, recurrent, mild: Secondary | ICD-10-CM | POA: Diagnosis not present

## 2020-12-26 DIAGNOSIS — F4310 Post-traumatic stress disorder, unspecified: Secondary | ICD-10-CM | POA: Diagnosis not present

## 2020-12-26 DIAGNOSIS — F411 Generalized anxiety disorder: Secondary | ICD-10-CM | POA: Diagnosis not present

## 2020-12-26 DIAGNOSIS — F902 Attention-deficit hyperactivity disorder, combined type: Secondary | ICD-10-CM | POA: Diagnosis not present

## 2021-01-15 DIAGNOSIS — F411 Generalized anxiety disorder: Secondary | ICD-10-CM | POA: Diagnosis not present

## 2021-01-17 ENCOUNTER — Other Ambulatory Visit: Payer: BC Managed Care – PPO

## 2021-01-17 DIAGNOSIS — Z20822 Contact with and (suspected) exposure to covid-19: Secondary | ICD-10-CM | POA: Diagnosis not present

## 2021-01-18 LAB — SARS-COV-2, NAA 2 DAY TAT

## 2021-01-18 LAB — NOVEL CORONAVIRUS, NAA: SARS-CoV-2, NAA: DETECTED — AB

## 2021-02-12 DIAGNOSIS — F411 Generalized anxiety disorder: Secondary | ICD-10-CM | POA: Diagnosis not present

## 2021-03-07 DIAGNOSIS — F411 Generalized anxiety disorder: Secondary | ICD-10-CM | POA: Diagnosis not present

## 2021-03-18 DIAGNOSIS — F330 Major depressive disorder, recurrent, mild: Secondary | ICD-10-CM | POA: Diagnosis not present

## 2021-03-18 DIAGNOSIS — F411 Generalized anxiety disorder: Secondary | ICD-10-CM | POA: Diagnosis not present

## 2021-03-18 DIAGNOSIS — F902 Attention-deficit hyperactivity disorder, combined type: Secondary | ICD-10-CM | POA: Diagnosis not present

## 2021-03-18 DIAGNOSIS — F4310 Post-traumatic stress disorder, unspecified: Secondary | ICD-10-CM | POA: Diagnosis not present

## 2021-04-04 DIAGNOSIS — F411 Generalized anxiety disorder: Secondary | ICD-10-CM | POA: Diagnosis not present

## 2021-04-06 DIAGNOSIS — F1290 Cannabis use, unspecified, uncomplicated: Secondary | ICD-10-CM | POA: Diagnosis not present

## 2021-04-06 DIAGNOSIS — R0602 Shortness of breath: Secondary | ICD-10-CM | POA: Diagnosis not present

## 2021-04-06 DIAGNOSIS — R42 Dizziness and giddiness: Secondary | ICD-10-CM | POA: Diagnosis not present

## 2021-04-06 DIAGNOSIS — R0789 Other chest pain: Secondary | ICD-10-CM | POA: Diagnosis not present

## 2021-04-06 DIAGNOSIS — F17290 Nicotine dependence, other tobacco product, uncomplicated: Secondary | ICD-10-CM | POA: Diagnosis not present

## 2021-04-06 DIAGNOSIS — F419 Anxiety disorder, unspecified: Secondary | ICD-10-CM | POA: Diagnosis not present

## 2021-04-06 DIAGNOSIS — Z20822 Contact with and (suspected) exposure to covid-19: Secondary | ICD-10-CM | POA: Diagnosis not present

## 2021-04-07 DIAGNOSIS — R0602 Shortness of breath: Secondary | ICD-10-CM | POA: Diagnosis not present

## 2021-04-11 ENCOUNTER — Encounter: Payer: Self-pay | Admitting: Internal Medicine

## 2021-04-11 ENCOUNTER — Ambulatory Visit: Payer: BC Managed Care – PPO | Admitting: Internal Medicine

## 2021-04-11 ENCOUNTER — Other Ambulatory Visit: Payer: Self-pay

## 2021-04-11 VITALS — BP 162/108 | HR 68 | Temp 97.6°F | Resp 16 | Ht 77.0 in | Wt 250.0 lb

## 2021-04-11 DIAGNOSIS — Z7252 High risk homosexual behavior: Secondary | ICD-10-CM | POA: Insufficient documentation

## 2021-04-11 DIAGNOSIS — Z0001 Encounter for general adult medical examination with abnormal findings: Secondary | ICD-10-CM

## 2021-04-11 DIAGNOSIS — F900 Attention-deficit hyperactivity disorder, predominantly inattentive type: Secondary | ICD-10-CM

## 2021-04-11 DIAGNOSIS — E559 Vitamin D deficiency, unspecified: Secondary | ICD-10-CM

## 2021-04-11 DIAGNOSIS — I10 Essential (primary) hypertension: Secondary | ICD-10-CM

## 2021-04-11 LAB — BASIC METABOLIC PANEL
BUN: 12 mg/dL (ref 6–23)
CO2: 25 mEq/L (ref 19–32)
Calcium: 9.3 mg/dL (ref 8.4–10.5)
Chloride: 107 mEq/L (ref 96–112)
Creatinine, Ser: 0.99 mg/dL (ref 0.40–1.50)
GFR: 101.52 mL/min (ref >60.00)
Glucose, Bld: 98 mg/dL (ref 70–99)
Potassium: 4 mEq/L (ref 3.5–5.1)
Sodium: 141 mEq/L (ref 135–145)

## 2021-04-11 LAB — URINALYSIS, ROUTINE W REFLEX MICROSCOPIC
Bilirubin Urine: NEGATIVE
Ketones, ur: NEGATIVE
Leukocytes,Ua: NEGATIVE
Nitrite: NEGATIVE
Specific Gravity, Urine: 1.025 (ref 1.000–1.030)
Total Protein, Urine: NEGATIVE
Urine Glucose: NEGATIVE
Urobilinogen, UA: 0.2 (ref 0.0–1.0)
pH: 6 (ref 5.0–8.0)

## 2021-04-11 LAB — CBC WITH DIFFERENTIAL/PLATELET
Basophils Absolute: 0 10*3/uL (ref 0.0–0.1)
Basophils Relative: 0.4 % (ref 0.0–3.0)
Eosinophils Absolute: 0.2 10*3/uL (ref 0.0–0.7)
Eosinophils Relative: 3.5 % (ref 0.0–5.0)
HCT: 42.7 % (ref 39.0–52.0)
Hemoglobin: 14.4 g/dL (ref 13.0–17.0)
Lymphocytes Relative: 37.8 % (ref 12.0–46.0)
Lymphs Abs: 1.9 10*3/uL (ref 0.7–4.0)
MCHC: 33.7 g/dL (ref 30.0–36.0)
MCV: 92.6 fl (ref 78.0–100.0)
Monocytes Absolute: 0.4 10*3/uL (ref 0.1–1.0)
Monocytes Relative: 7.2 % (ref 3.0–12.0)
Neutro Abs: 2.6 10*3/uL (ref 1.4–7.7)
Neutrophils Relative %: 51.1 % (ref 43.0–77.0)
Platelets: 242 10*3/uL (ref 150.0–400.0)
RBC: 4.61 Mil/uL (ref 4.22–5.81)
RDW: 12.4 % (ref 11.5–15.5)
WBC: 5 10*3/uL (ref 4.0–10.5)

## 2021-04-11 LAB — LIPID PANEL
Cholesterol: 156 mg/dL (ref 0–200)
HDL: 37.7 mg/dL — ABNORMAL LOW (ref >39.00)
LDL Cholesterol: 103 mg/dL — ABNORMAL HIGH (ref 0–99)
NonHDL: 118.02
Total CHOL/HDL Ratio: 4
Triglycerides: 74 mg/dL (ref 0.0–149.0)
VLDL: 14.8 mg/dL (ref 0.0–40.0)

## 2021-04-11 LAB — TSH: TSH: 0.56 u[IU]/mL (ref 0.35–4.50)

## 2021-04-11 LAB — VITAMIN D 25 HYDROXY (VIT D DEFICIENCY, FRACTURES): VITD: 10.99 ng/mL — ABNORMAL LOW (ref 30.00–100.00)

## 2021-04-11 MED ORDER — ATOMOXETINE HCL 40 MG PO CAPS: 40.0000 mg | ORAL_CAPSULE | Freq: Every day | ORAL | 0 refills | Status: DC

## 2021-04-11 NOTE — Progress Notes (Signed)
Subjective:  Patient ID: Jonathan White, male    DOB: [birth year]  Age: 30 y.o. MRN: [id number]  CC: Annual Exam and Hypertension  This visit occurred during the SARS-CoV-2 public health emergency.  Safety protocols were in place, including screening questions prior to the visit, additional usage of staff PPE, and extensive cleaning of exam room while observing appropriate contact time as indicated for disinfecting solutions.    HPI [name] presents for a CPX, f/up, and to establish.  He has a history of hypertension that was previously but is not currently being treated.  He complains of intermittent chest tightness and shortness of breath that occurs at rest.  He thinks it is related to stress.  He does not have chest pain or dyspnea on exertion with activity.  He considers his sexual practices to be high risk and was taking descovy a few months ago but has since run out of it and is not currently taking it.  He has been diagnosed with ADHD but is not currently being treated.  He has been diagnosed with OSA but has not started using the CPAP equipment yet.  Outpatient Medications Prior to Visit  Medication Sig Dispense Refill  . amoxicillin (AMOXIL) 875 MG tablet Take 1 tablet (875 mg total) by mouth 2 (two) times daily. 20 tablet 0  . hydrOXYzine (VISTARIL) 50 MG capsule Take 1 capsule (50 mg total) by mouth at bedtime as needed for anxiety. 30 capsule 1  . Lactobacillus (LACTINEX) PACK Mix 1/2 packet with soft food and take twice a day for 5 days. 5 each 0  . lamoTRIgine (LAMICTAL) 150 MG tablet Take 1 tablet (150 mg total) by mouth daily. 30 tablet 2   No facility-administered medications prior to visit.    ROS Review of Systems  Constitutional: Positive for unexpected weight change (wt gain). Negative for diaphoresis and fatigue.  HENT: Negative.   Eyes: Negative.   Respiratory: Positive for apnea, chest tightness and shortness of breath. Negative for cough and  wheezing.   Cardiovascular: Negative for chest pain, palpitations and leg swelling.  Gastrointestinal: Negative for abdominal pain, constipation, diarrhea, nausea and vomiting.  Endocrine: Negative.   Genitourinary: Negative.  Negative for difficulty urinating, dysuria, genital sores, penile swelling, scrotal swelling, testicular pain and urgency.  Musculoskeletal: Negative.   Skin: Negative.  Negative for rash.  Neurological: Negative.  Negative for dizziness, weakness, light-headedness, numbness and headaches.  Hematological: Negative for adenopathy. Does not bruise/bleed easily.  Psychiatric/Behavioral: Positive for decreased concentration. Negative for behavioral problems, dysphoric mood and suicidal ideas. The patient is not nervous/anxious.     Objective:  BP (!) 162/108 (BP Location: Left Arm, Patient Position: Sitting, Cuff Size: Large)   Pulse 68   Temp 97.6 F (36.4 C) (Oral)   Resp 16   Ht 6\' 5"  (1.956 m)   Wt 250 lb (113.4 kg)   SpO2 97%   BMI 29.65 kg/m   BP Readings from Last 3 Encounters:  04/11/21 (!) 162/108  02/21/19 (!) 137/93  05/22/16 124/86    Wt Readings from Last 3 Encounters:  04/11/21 250 lb (113.4 kg)  02/21/19 244 lb (110.7 kg)  05/22/16 246 lb 9.6 oz (111.9 kg)    Physical Exam Vitals reviewed.  Constitutional:      Appearance: Normal appearance.  HENT:     Nose: Nose normal.     Mouth/Throat:     Mouth: Mucous membranes are moist.  Eyes:     General: No scleral icterus.  Conjunctiva/sclera: Conjunctivae normal.  Cardiovascular:     Rate and Rhythm: Normal rate and regular rhythm.     Heart sounds: No murmur heard.     Comments: EKG- NSR, 64 bpm Large Q waves in anteroseptal leads is not new No LVH Pulmonary:     Effort: Pulmonary effort is normal.     Breath sounds: No stridor. No wheezing, rhonchi or rales.  Abdominal:     Palpations: There is no mass.     Tenderness: There is no abdominal tenderness. There is no guarding.   Genitourinary:    Comments: GU/rectal were deferred at his request Musculoskeletal:        General: Normal range of motion.     Cervical back: Neck supple.     Right lower leg: No edema.     Left lower leg: No edema.  Lymphadenopathy:     Cervical: No cervical adenopathy.  Skin:    General: Skin is warm and dry.  Neurological:     General: No focal deficit present.     Mental Status: He is alert.  Psychiatric:        Mood and Affect: Mood normal.        Behavior: Behavior normal.     Lab Results  Component Value Date   WBC 5.0 04/11/2021   HGB 14.4 04/11/2021   HCT 42.7 04/11/2021   PLT 242.0 04/11/2021   GLUCOSE 98 04/11/2021   CHOL 156 04/11/2021   TRIG 74.0 04/11/2021   HDL 37.70 (L) 04/11/2021   LDLCALC 103 (H) 04/11/2021   ALT 28 04/11/2021   AST 18 04/11/2021   NA 141 04/11/2021   K 4.0 04/11/2021   CL 107 04/11/2021   CREATININE 0.99 04/11/2021   BUN 12 04/11/2021   CO2 25 04/11/2021   TSH 0.56 04/11/2021    No results found.  Assessment & Plan:   Diamond was seen today for annual exam and hypertension.  Diagnoses and all orders for this visit:  Primary hypertension- His blood pressure is not adequately well controlled.  Labs are negative for secondary causes and endorgan damage.  His EKG is reassuring.  Will treat this with a combination of triamterene and hydrochlorothiazide. -     CBC with Differential/Platelet; Future -     Basic metabolic panel; Future -     TSH; Future -     Urinalysis, Routine w reflex microscopic; Future -     VITAMIN D 25 Hydroxy (Vit-D Deficiency, Fractures); Future -     Hepatic function panel; Future -     Aldosterone + renin activity w/ ratio; Future -     Cancel: Urine drugs of abuse scrn w alc, routine (Ref Lab); Future -     Cancel: Urine drugs of abuse scrn w alc, routine (Ref Lab) -     Aldosterone + renin activity w/ ratio -     Hepatic function panel -     VITAMIN D 25 Hydroxy (Vit-D Deficiency,  Fractures) -     Urinalysis, Routine w reflex microscopic -     TSH -     Basic metabolic panel -     CBC with Differential/Platelet -     triamterene-hydrochlorothiazide (DYAZIDE) 37.5-25 MG capsule; Take 1 each (1 capsule total) by mouth daily. -     EKG 12-Lead  High risk homosexual behavior- Screening for HIV and hep B are negative.  He has normal renal function.  He can restart descovy. -  Hepatitis B surface antigen; Future -     Hepatitis B surface antibody,quantitative; Future -     Hepatitis A antibody, total; Future -     Hepatitis B core antibody, total; Future -     RPR; Future -     RPR -     Hepatitis B core antibody, total -     Hepatitis A antibody, total -     Hepatitis B surface antibody,quantitative -     Hepatitis B surface antigen -     emtricitabine-tenofovir AF (DESCOVY) 200-25 MG tablet; Take 1 tablet by mouth daily.  Encounter for general adult medical examination with abnormal findings- Exam completed, labs reviewed, vaccines reviewed - He refused a Tdap, he is immune to hep B, I recommended that he get a hep A vaccine, patient education material was given. -     Lipid panel; Future -     Hepatitis C antibody; Future -     HIV Antibody (routine testing w rflx); Future -     HIV Antibody (routine testing w rflx) -     Hepatitis C antibody -     Lipid panel  ADHD (attention deficit hyperactivity disorder), inattentive type -     Discontinue: atomoxetine (STRATTERA) 40 MG capsule; Take 1 capsule (40 mg total) by mouth daily.  Vitamin D deficiency disease -     Cholecalciferol 1.25 MG (50000 UT) capsule; Take 1 capsule (50,000 Units total) by mouth once a week.   I have discontinued [name]'s Lactinex, lamoTRIgine, hydrOXYzine, and amoxicillin. I am also having him start on Cholecalciferol, triamterene-hydrochlorothiazide, and Descovy.  Meds ordered this encounter  Medications  . DISCONTD: atomoxetine (STRATTERA) 40 MG capsule     Sig: Take 1 capsule (40 mg total) by mouth daily.    Dispense:  30 capsule    Refill:  0  . Cholecalciferol 1.25 MG (50000 UT) capsule    Sig: Take 1 capsule (50,000 Units total) by mouth once a week.    Dispense:  12 capsule    Refill:  1  . triamterene-hydrochlorothiazide (DYAZIDE) 37.5-25 MG capsule    Sig: Take 1 each (1 capsule total) by mouth daily.    Dispense:  90 capsule    Refill:  0  . emtricitabine-tenofovir AF (DESCOVY) 200-25 MG tablet    Sig: Take 1 tablet by mouth daily.    Dispense:  90 tablet    Refill:  0     Follow-up: Return in about 6 weeks (around 05/23/2021).  Scarlette Calico, MD

## 2021-04-11 NOTE — Patient Instructions (Signed)

## 2021-04-12 ENCOUNTER — Other Ambulatory Visit: Payer: Self-pay | Admitting: Internal Medicine

## 2021-04-12 DIAGNOSIS — F900 Attention-deficit hyperactivity disorder, predominantly inattentive type: Secondary | ICD-10-CM

## 2021-04-12 DIAGNOSIS — E559 Vitamin D deficiency, unspecified: Secondary | ICD-10-CM | POA: Insufficient documentation

## 2021-04-12 LAB — HEPATIC FUNCTION PANEL
ALT: 28 U/L (ref 0–53)
AST: 18 U/L (ref 0–37)
Albumin: 4.2 g/dL (ref 3.5–5.2)
Alkaline Phosphatase: 43 U/L (ref 39–117)
Bilirubin, Direct: 0.1 mg/dL (ref 0.0–0.3)
Total Bilirubin: 0.6 mg/dL (ref 0.2–1.2)
Total Protein: 7.5 g/dL (ref 6.0–8.3)

## 2021-04-12 MED ORDER — TRIAMTERENE-HCTZ 37.5-25 MG PO CAPS: 1.0000 | ORAL_CAPSULE | Freq: Every day | ORAL | 0 refills | Status: DC

## 2021-04-12 MED ORDER — CHOLECALCIFEROL 1.25 MG (50000 UT) PO CAPS: 50000.0000 [IU] | ORAL_CAPSULE | ORAL | 1 refills | Status: DC

## 2021-04-16 ENCOUNTER — Telehealth: Payer: Self-pay

## 2021-04-16 NOTE — Telephone Encounter (Signed)
Approved 04/16/2021 - 04/15/2024

## 2021-04-16 NOTE — Telephone Encounter (Signed)
Key: [id number]

## 2021-04-18 MED ORDER — DESCOVY 200-25 MG PO TABS: 1.0000 | ORAL_TABLET | Freq: Every day | ORAL | 0 refills | Status: DC

## 2021-04-20 LAB — ALDOSTERONE + RENIN ACTIVITY W/ RATIO
ALDO / PRA Ratio: 7.7 Ratio (ref 0.9–28.9)
Aldosterone: 2 ng/dL
Renin Activity: 0.26 ng/mL/h (ref 0.25–5.82)

## 2021-04-20 LAB — HEPATITIS A ANTIBODY, TOTAL: Hepatitis A AB,Total: BORDERLINE — AB

## 2021-04-20 LAB — HEPATITIS B CORE ANTIBODY, TOTAL: Hep B Core Total Ab: NONREACTIVE

## 2021-04-20 LAB — HEPATITIS C ANTIBODY
Hepatitis C Ab: NONREACTIVE
SIGNAL TO CUT-OFF: 0.05 (ref <1.00)

## 2021-04-20 LAB — HIV ANTIBODY (ROUTINE TESTING W REFLEX): HIV 1&2 Ab, 4th Generation: NONREACTIVE

## 2021-04-20 LAB — RPR: RPR Ser Ql: NONREACTIVE

## 2021-04-20 LAB — HEPATITIS B SURFACE ANTIGEN: Hepatitis B Surface Ag: NONREACTIVE

## 2021-04-20 LAB — HEPATITIS B SURFACE ANTIBODY, QUANTITATIVE: Hep B S AB Quant (Post): 40 m[IU]/mL (ref >=10)

## 2021-04-26 LAB — URINE DRUGS OF ABUSE SCREEN W ALC, ROUTINE (REF LAB)
Amphetamines, Urine: NEGATIVE ng/mL
Barbiturate Quant, Ur: NEGATIVE ng/mL
Benzodiazepine Quant, Ur: NEGATIVE ng/mL
Cocaine (Metab.): NEGATIVE ng/mL
Ethanol, Urine: NEGATIVE %
Methadone Screen, Urine: NEGATIVE ng/mL
Opiate Quant, Ur: NEGATIVE ng/mL
PCP Quant, Ur: NEGATIVE ng/mL
Propoxyphene: NEGATIVE ng/mL

## 2021-04-26 LAB — PANEL 799049
CARBOXY THC GC/MS CONF: 119 ng/mL
Cannabinoid GC/MS, Ur: POSITIVE — AB

## 2021-05-01 ENCOUNTER — Encounter: Payer: Self-pay | Admitting: Internal Medicine

## 2021-05-15 ENCOUNTER — Ambulatory Visit: Payer: BC Managed Care – PPO | Admitting: Internal Medicine

## 2021-05-15 ENCOUNTER — Other Ambulatory Visit: Payer: Self-pay

## 2021-05-15 ENCOUNTER — Encounter: Payer: Self-pay | Admitting: Internal Medicine

## 2021-05-15 VITALS — BP 132/72 | HR 77 | Temp 98.6°F | Ht 72.0 in | Wt 250.0 lb

## 2021-05-15 DIAGNOSIS — K648 Other hemorrhoids: Secondary | ICD-10-CM | POA: Diagnosis not present

## 2021-05-15 DIAGNOSIS — F900 Attention-deficit hyperactivity disorder, predominantly inattentive type: Secondary | ICD-10-CM

## 2021-05-15 DIAGNOSIS — Z7252 High risk homosexual behavior: Secondary | ICD-10-CM

## 2021-05-15 DIAGNOSIS — I10 Essential (primary) hypertension: Secondary | ICD-10-CM | POA: Diagnosis not present

## 2021-05-15 MED ORDER — ATOMOXETINE HCL 40 MG PO CAPS: 40.0000 mg | ORAL_CAPSULE | Freq: Two times a day (BID) | ORAL | 1 refills | Status: DC

## 2021-05-15 NOTE — Progress Notes (Signed)
Subjective:  Patient ID: Jonathan White, male    DOB: 06-Aug-1991  Age: 30 y.o. MRN: [id number]  CC: Follow-up  This visit occurred during the SARS-CoV-2 public health emergency.  Safety protocols were in place, including screening questions prior to the visit, additional usage of staff PPE, and extensive cleaning of exam room while observing appropriate contact time as indicated for disinfecting solutions.    HPI [name] presents for f/up -  He complains of a several year history of tenesmus and blood on the toilet paper.  Over the last few weeks he has had mild nausea but he denies vomiting, abdominal pain, diarrhea, or constipation.  Outpatient Medications Prior to Visit  Medication Sig Dispense Refill   Cholecalciferol 1.25 MG (50000 UT) capsule Take 1 capsule (50,000 Units total) by mouth once a week. 12 capsule 1   emtricitabine-tenofovir AF (DESCOVY) 200-25 MG tablet Take 1 tablet by mouth daily. 90 tablet 0   triamterene-hydrochlorothiazide (DYAZIDE) 37.5-25 MG capsule Take 1 each (1 capsule total) by mouth daily. 90 capsule 0   atomoxetine (STRATTERA) 40 MG capsule TAKE 1 CAPSULE(40 MG) BY MOUTH DAILY 30 capsule 0   No facility-administered medications prior to visit.    ROS Review of Systems  Constitutional: Negative.  Negative for diaphoresis, fatigue and unexpected weight change.  HENT: Negative.    Eyes: Negative.   Respiratory:  Negative for cough, chest tightness, shortness of breath and wheezing.   Cardiovascular:  Negative for chest pain and palpitations.  Gastrointestinal:  Positive for anal bleeding, blood in stool, nausea and rectal pain. Negative for constipation, diarrhea and vomiting.  Endocrine: Negative.   Genitourinary: Negative.  Negative for dysuria, hematuria, penile swelling and scrotal swelling.  Musculoskeletal: Negative.   Skin: Negative.  Negative for rash.  Neurological: Negative.  Negative for dizziness, weakness,  light-headedness, numbness and headaches.  Hematological:  Negative for adenopathy. Does not bruise/bleed easily.  Psychiatric/Behavioral: Negative.     Objective:  BP 132/72 (BP Location: Right Arm, Patient Position: Sitting, Cuff Size: Large)   Pulse 77   Temp 98.6 F (37 C) (Oral)   Ht 6' (1.829 m)   Wt 250 lb (113.4 kg)   SpO2 95%   BMI 33.91 kg/m   BP Readings from Last 3 Encounters:  05/15/21 132/72  04/11/21 (!) 162/108  02/21/19 (!) 137/93    Wt Readings from Last 3 Encounters:  05/15/21 250 lb (113.4 kg)  04/11/21 250 lb (113.4 kg)  02/21/19 244 lb (110.7 kg)    Physical Exam Vitals reviewed. Exam conducted with a chaperone present (Shirron).  Constitutional:      Appearance: Normal appearance.  HENT:     Nose: Nose normal.     Mouth/Throat:     Mouth: Mucous membranes are moist.  Eyes:     General: No scleral icterus.    Conjunctiva/sclera: Conjunctivae normal.  Cardiovascular:     Rate and Rhythm: Normal rate and regular rhythm.  Pulmonary:     Effort: Pulmonary effort is normal.     Breath sounds: No stridor. No wheezing, rhonchi or rales.  Abdominal:     General: Abdomen is flat. Bowel sounds are normal. There is no distension.     Palpations: Abdomen is soft. There is no hepatomegaly, splenomegaly or mass.     Hernia: There is no hernia in the left inguinal area or right inguinal area.  Genitourinary:    Pubic Area: No rash.      Penis: Normal and circumcised. No  tenderness or lesions.      Testes: Normal.        Right: Mass, tenderness or swelling not present.        Left: Mass, tenderness or swelling not present.     Epididymis:     Right: Normal.     Left: Normal.     Prostate: Not enlarged, not tender and no nodules present.     Rectum: Guaiac result positive. Internal hemorrhoid present. No mass, tenderness, anal fissure or external hemorrhoid. Normal anal tone.     Comments: Uncomplicated internal anal hemorrhoids Musculoskeletal:      Cervical back: Neck supple.  Lymphadenopathy:     Lower Body: No right inguinal adenopathy. No left inguinal adenopathy.  Neurological:     Mental Status: He is alert.    Lab Results  Component Value Date   WBC 5.0 04/11/2021   HGB 14.4 04/11/2021   HCT 42.7 04/11/2021   PLT 242.0 04/11/2021   GLUCOSE 98 04/11/2021   CHOL 156 04/11/2021   TRIG 74.0 04/11/2021   HDL 37.70 (L) 04/11/2021   LDLCALC 103 (H) 04/11/2021   ALT 28 04/11/2021   AST 18 04/11/2021   NA 141 04/11/2021   K 4.0 04/11/2021   CL 107 04/11/2021   CREATININE 0.99 04/11/2021   BUN 12 04/11/2021   CO2 25 04/11/2021   TSH 0.56 04/11/2021    No results found.  Assessment & Plan:   Jonathan White was seen today for follow-up.  Diagnoses and all orders for this visit:  Hemorrhoids, internal, with bleeding- He says the symptoms are quite bothersome.  I recommended that he see GI to see if he would benefit from a banding procedure. -     Ambulatory referral to Gastroenterology  High risk homosexual behavior  ADHD (attention deficit hyperactivity disorder), inattentive type -     atomoxetine (STRATTERA) 40 MG capsule; Take 1 capsule (40 mg total) by mouth 2 (two) times daily with a meal.  Primary hypertension- His blood pressure is adequately well controlled.  I have changed Jonathan Rigger "Terrell"'s atomoxetine. I am also having him maintain his Cholecalciferol, triamterene-hydrochlorothiazide, and Descovy.  Meds ordered this encounter  Medications   atomoxetine (STRATTERA) 40 MG capsule    Sig: Take 1 capsule (40 mg total) by mouth 2 (two) times daily with a meal.    Dispense:  180 capsule    Refill:  1     Follow-up: Return in about 4 months (around 09/14/2021).  Jonathan Calico, MD

## 2021-05-15 NOTE — Patient Instructions (Signed)

## 2021-05-31 ENCOUNTER — Encounter: Payer: Self-pay | Admitting: Gastroenterology

## 2021-07-03 ENCOUNTER — Ambulatory Visit: Payer: BC Managed Care – PPO | Admitting: Gastroenterology

## 2021-08-16 ENCOUNTER — Encounter: Payer: Self-pay | Admitting: Gastroenterology

## 2021-08-16 ENCOUNTER — Ambulatory Visit: Payer: BC Managed Care – PPO | Admitting: Gastroenterology

## 2021-08-16 VITALS — BP 136/80 | HR 80 | Ht 72.0 in | Wt 247.0 lb

## 2021-08-16 DIAGNOSIS — K602 Anal fissure, unspecified: Secondary | ICD-10-CM | POA: Diagnosis not present

## 2021-08-16 DIAGNOSIS — K921 Melena: Secondary | ICD-10-CM | POA: Diagnosis not present

## 2021-08-16 MED ORDER — AMBULATORY NON FORMULARY MEDICATION: 0 refills | Status: DC

## 2021-08-16 NOTE — Patient Instructions (Addendum)
If you are age 32 or older, your body mass index should be between 23-30. Your Body mass index is 33.5 kg/m. If this is out of the aforementioned range listed, please consider follow up with your Primary Care Provider.  If you are age 20 or younger, your body mass index should be between 19-25. Your Body mass index is 33.5 kg/m. If this is out of the aformentioned range listed, please consider follow up with your Primary Care Provider.   We have sent a prescription for nitroglycerin 0.125% gel to [facility]. You should apply a pea size amount to your rectum three times daily x 6-8 weeks.  [facility]'s information is below: Address: [address] [place], [place] [address]  [phone]  *Please DO NOT go directly from our office to pick up this medication! Give the pharmacy 1 day to process the prescription as this is compounded and takes time to make.  The [place] [facility] providers would like to encourage you to use [facility] to communicate with providers for non-urgent requests or questions.  Due to long hold times on the telephone, sending your provider a message by [facility] may be a faster and more efficient way to get a response.  Please allow 48 business hours for a response.  Please remember that this is for non-urgent requests.   It was a pleasure to see you today!  Thank you for trusting me with your gastrointestinal care!    Scott E. Candis Schatz, MD

## 2021-08-16 NOTE — Progress Notes (Signed)
HPI : Jonathan White is a pleasant 30 year old male with a history of bipolar disorder referred to [place] by Dr. Scarlette Calico for further evaluation of bright red blood per rectum.  He first noticed blood in the stool several years ago.  It has been more consistent over the past few months.  He just sees blood on the toilet paper.  He does engage in anoreceptive intercourse and will sometimes have pain with intercourse.  He sometimes has pain with the passage of stool.  He denies symptoms of prolapsing hemorrhoids.  Most of the time he sees blood it is painless. He has a bowel movement most everyday.  Sometimes has straining.  Stool is usually formed and solid.  He denies symptoms of urgency or tenesmus.  No abdominal pain or diarrhea.  No mucus in stool.  No swollen inguinal lymph nodes. No family history of colon cancer.      Past Medical History:  Diagnosis Date   Bipolar 1 disorder ([place])    Bipolar affective disorder ([place])    Depression      Past Surgical History:  Procedure Laterality Date   NO PAST SURGERIES     Family History  Problem Relation Age of Onset   Hypertension Father    Stroke Father    Alcohol abuse Sister    Mental illness Sister    Schizophrenia Brother    Mental illness Brother    Colon cancer Neg Hx    Esophageal cancer Neg Hx    Pancreatic cancer Neg Hx    Stomach cancer Neg Hx    Liver disease Neg Hx    Social History   Tobacco Use   Smoking status: Never   Smokeless tobacco: Never  Vaping Use   Vaping Use: Never used  Substance Use Topics   Alcohol use: Yes    Alcohol/week: 0.0 standard drinks    Comment: 4x a month   Drug use: Yes    Frequency: 1.0 times per week    Types: Marijuana   Current Outpatient Medications  Medication Sig Dispense Refill   atomoxetine (STRATTERA) 40 MG capsule Take 1 capsule (40 mg total) by mouth 2 (two) times daily with a meal. 180 capsule 1   emtricitabine-tenofovir AF (DESCOVY) 200-25 MG tablet Take 1  tablet by mouth daily. 90 tablet 0   No current facility-administered medications for this visit.   No Known Allergies   Review of Systems: All systems reviewed and negative except where noted in HPI.    No results found.  Physical Exam: Ht 6' (1.829 m)   Wt 247 lb (112 kg)   BMI 33.50 kg/m  Constitutional: Pleasant,well-developed, African-American male in no acute distress. HEENT: Normocephalic and atraumatic. Conjunctivae are normal. No scleral icterus. Neck supple.  Cardiovascular: Normal rate, regular rhythm.  Pulmonary/chest: Effort normal and breath sounds normal. No wheezing, rales or rhonchi. Abdominal: Soft, nondistended, nontender. Bowel sounds active throughout. There are no masses palpable. No hepatomegaly. Extremities: no edema Rectal:  Very small midline posterior anal fissure visible on exam.  Perianal exam otherwise normal with no skin tags, no external hemorrhoids.  Digital rectal exam normal, with normal sphincter tone, no mass lesion.  No fissure palpated within the anal canal Neurological: Alert and oriented to person place and time. Skin: Skin is warm and dry. No rashes noted. Psychiatric: Normal mood and affect. Behavior is normal.  CBC    Component Value Date/Time   WBC 5.0 04/11/2021 1151   RBC 4.61 04/11/2021  1151   HGB 14.4 04/11/2021 1151   HCT 42.7 04/11/2021 1151   PLT 242.0 04/11/2021 1151   MCV 92.6 04/11/2021 1151   MCH 31.4 02/21/2019 0706   MCHC 33.7 04/11/2021 1151   RDW 12.4 04/11/2021 1151   LYMPHSABS 1.9 04/11/2021 1151   MONOABS 0.4 04/11/2021 1151   EOSABS 0.2 04/11/2021 1151   BASOSABS 0.0 04/11/2021 1151    CMP     Component Value Date/Time   NA 141 04/11/2021 1151   K 4.0 04/11/2021 1151   CL 107 04/11/2021 1151   CO2 25 04/11/2021 1151   GLUCOSE 98 04/11/2021 1151   BUN 12 04/11/2021 1151   CREATININE 0.99 04/11/2021 1151   CALCIUM 9.3 04/11/2021 1151   PROT 7.5 04/11/2021 1151   ALBUMIN 4.2 04/11/2021 1151   AST  18 04/11/2021 1151   ALT 28 04/11/2021 1151   ALKPHOS 43 04/11/2021 1151   BILITOT 0.6 04/11/2021 1151   GFRNONAA >60 02/21/2019 0706   GFRAA >60 02/21/2019 0706     ASSESSMENT AND PLAN: 30 year old male with chronic intermittent hematochezia, sometimes associated with pain, without change in bowel habits.  He has a very small posterior midline anal fissure on exam, which may explain his symptoms.  He may also have internal hemorrhoids which would explain why sometimes he has painless hematochezia.  He does engage in anal receptive intercourse which puts him at risk for some sexually transmitted infections of the rectum, but currently his symptoms do not seem at all consistent with that (no symptoms of proctitis).  For now, I recommend we treat medically for an anal fissure with intra-anal nitroglycerin and Metamucil.  He can also do nightly sitz baths.  If there is no improvement in his symptoms after 6 weeks, I recommend we perform either a flexible sigmoidoscopy or full colonoscopy.  Anal fissure/hematochezia - Intra-anal nitroglycerin twice daily - Daily Metamucil - Sits baths - Abstinence from anoreceptive intercourse until symptoms resolve  Keithon Mccoin E. Candis Schatz, MD [facility]   CC:  Janith Lima, MD

## 2021-08-19 ENCOUNTER — Encounter: Payer: Self-pay | Admitting: Gastroenterology

## 2021-09-16 ENCOUNTER — Ambulatory Visit: Payer: BC Managed Care – PPO | Admitting: Internal Medicine

## 2021-10-02 ENCOUNTER — Ambulatory Visit (INDEPENDENT_AMBULATORY_CARE_PROVIDER_SITE_OTHER): Payer: BC Managed Care – PPO | Admitting: Gastroenterology

## 2021-10-02 ENCOUNTER — Encounter: Payer: Self-pay | Admitting: Gastroenterology

## 2021-10-02 VITALS — BP 128/80 | HR 88 | Ht 71.0 in | Wt 251.1 lb

## 2021-10-02 DIAGNOSIS — K602 Anal fissure, unspecified: Secondary | ICD-10-CM

## 2021-10-02 NOTE — Patient Instructions (Signed)
If you are age 32 or older, your body mass index should be between 23-30. Your Body mass index is 35.02 kg/m. If this is out of the aforementioned range listed, please consider follow up with your Primary Care Provider.  If you are age 43 or younger, your body mass index should be between 19-25. Your Body mass index is 35.02 kg/m. If this is out of the aformentioned range listed, please consider follow up with your Primary Care Provider.   Stop Nitroglycerin gel. Continue fiber indefinitely.   The [place] [facility] providers would like to encourage you to use [facility] to communicate with providers for non-urgent requests or questions.  Due to long hold times on the telephone, sending your provider a message by [facility] may be a faster and more efficient way to get a response.  Please allow 48 business hours for a response.  Please remember that this is for non-urgent requests.   It was a pleasure to see you today!  Thank you for trusting me with your gastrointestinal care!    Scott E.Candis Schatz, MD

## 2021-10-02 NOTE — Progress Notes (Signed)
    History of Present Illness:  Jonathan White is a very pleasant 30 year old male who I saw in clinic on September 23 for symptoms of bright red blood per rectum and painful defecation.  He was found to have a small posterior midline anal fissure on exam.  He was prescribed intra-anal nitroglycerin and daily Metamucil and he presents for follow-up today. Today, the patient reports significant improvement in his symptoms.  He has not seen any blood in his stool for about 2 weeks now.  He has had little to no pain with passage of stool in the last few weeks.  His stools are also becoming more consistent in terms of consistency and regularity.  He is still taking the nitroglycerin and fiber capsules twice a day.  He is not having any new symptoms.    Current Medications, Allergies, Past Medical History, Past Surgical History, Family History and Social History were reviewed in [employer] record.  Studies:   No results found.   Physical Exam: General: Pleasant, well developed , African-American male in no acute distress Head: Normocephalic and atraumatic Eyes:  sclerae anicteric, conjunctiva pink  Musculoskeletal: Symmetrical with no gross deformities  Extremities: No edema  Neurological: Alert oriented x 4, grossly nonfocal Psychological:  Alert and cooperative. Normal mood and affect  Assessment and Recommendations: 30 year old male with symptomatic acute anal fissure, improved with fiber supplementation and intra-anal nitroglycerin.  No need for endoscopic evaluation.  I think he can stop the nitroglycerin at this point.  I recommended he continue with fiber supplementation indefinitely.  Continue to focus on avoiding hard stools and straining.  Patient was advised that he is at risk for anal fissure recurring, especially with anal receptive intercourse.  Should his symptoms return, he can resume intra-anal nitroglycerin, but if the symptoms become more chronic he may  need to pursue other therapies such as Botox or sphincterotomy.  Anal fissure/hematochezia, resolved - Stop nitroglycerin - Continue fiber supplementation indefinitely  Jonathan White E. Candis Schatz, MD [facility]

## 2021-10-03 ENCOUNTER — Encounter: Payer: Self-pay | Admitting: Gastroenterology

## 2021-11-08 ENCOUNTER — Encounter (HOSPITAL_COMMUNITY): Payer: Self-pay | Admitting: Emergency Medicine

## 2021-11-08 ENCOUNTER — Ambulatory Visit (HOSPITAL_COMMUNITY)
Admission: EM | Admit: 2021-11-08 | Discharge: 2021-11-08 | Disposition: A | Discharge status: Home / Self Care | Payer: BC Managed Care – PPO | Attending: Emergency Medicine | Admitting: Emergency Medicine

## 2021-11-08 ENCOUNTER — Other Ambulatory Visit: Payer: Self-pay

## 2021-11-08 DIAGNOSIS — L0201 Cutaneous abscess of face: Secondary | ICD-10-CM

## 2021-11-08 MED ORDER — IBUPROFEN 800 MG PO TABS: 800.0000 mg | ORAL_TABLET | Freq: Three times a day (TID) | ORAL | 0 refills | Status: DC

## 2021-11-08 MED ORDER — DOXYCYCLINE HYCLATE 100 MG PO CAPS: 100.0000 mg | ORAL_CAPSULE | Freq: Two times a day (BID) | ORAL | 0 refills | Status: DC

## 2021-11-08 NOTE — ED Triage Notes (Signed)
Pt reports that had what looked like a pimple on chin 3 days ago and popped it but pain and swelling has gotten worse.

## 2021-11-08 NOTE — Discharge Instructions (Addendum)
Take doxycyline twice  a day for 7 days  You may ibuprofen as needed every 8 hours fir pain in addition to tylenol or by itself    Hold warm-hot compresses to affected area at least 4 times a day, this helps to facilitate draining, the more the better  Please return for evaluation for increased swelling, increased tenderness or pain, non healing site, non draining site, you begin to have fever or chills   We reviewed the etiology of recurrent abscesses of skin.  Skin abscesses are collections of pus within the dermis and deeper skin tissues. Skin abscesses manifest as painful, tender, fluctuant, and erythematous nodules, frequently surmounted by a pustule and surrounded by a rim of erythematous swelling.  Spontaneous drainage of purulent material may occur.  Fever can occur on occasion.    -Skin abscesses can develop in healthy individuals with no predisposing conditions other than skin or nasal carriage of Staphylococcus aureus.  Individuals in close contact with others who have active infection with skin abscesses are at increased risk which is likely to explain why twin brother has similar episodes.   In addition, any process leading to a breach in the skin barrier can also predispose to the development of a skin abscesses, such as atopic dermatitis.

## 2021-11-08 NOTE — ED Provider Notes (Signed)
[facility]    CSN: [id number] Arrival date & time: 11/08/21  1402      History   Chief Complaint Chief Complaint  Patient presents with   Facial Swelling    HPI Jonathan White is a 30 y.o. male.   Patient presents with pimple on left side of chin that began 3 days ago.  Endorses that he attempted to pop it but t area became more painful and swelling worsened.  Site has increased in size.  Endorses chills 1 day ago.  No pertinent medical history.  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 Authorizing Provider  doxycycline (VIBRAMYCIN) 100 MG capsule Take 1 capsule (100 mg total) by mouth 2 (two) times daily. 11/08/21  Yes Ramani Riva R, NP  ibuprofen (ADVIL) 800 MG tablet Take 1 tablet (800 mg total) by mouth 3 (three) times daily. 11/08/21  Yes Suhey Radford, Leitha Schuller, NP  AMBULATORY NON FORMULARY MEDICATION Medication Name: Nitroglycerin 0.125% gel nitroglycerin 0.125% gel. Apply a pea size amount to the rectum three times daily x 6-8 weeks. 08/16/21   Daryel November, MD  atomoxetine (STRATTERA) 40 MG capsule Take 1 capsule (40 mg total) by mouth 2 (two) times daily with a meal. 05/15/21   Janith Lima, MD  Calcium Polycarbophil (FIBER-CAPS PO) Take 1  capsule by mouth in the morning and at bedtime.    [provider]  emtricitabine-tenofovir AF (DESCOVY) 200-25 MG tablet Take 1 tablet by mouth daily. 04/18/21   Janith Lima, MD    Family History Family History  Problem Relation Age of Onset   Hypertension Father    Stroke Father    Alcohol abuse Sister    Mental illness Sister    Schizophrenia Brother    Mental illness Brother    Colon cancer Neg Hx    Esophageal cancer Neg Hx    Pancreatic cancer Neg Hx    Stomach cancer Neg Hx    Liver disease Neg Hx     Social History Social History   Tobacco Use   Smoking status: Never   Smokeless tobacco: Never  Vaping Use   Vaping Use: Never used  Substance Use Topics   Alcohol use: Yes    Alcohol/week: 0.0 standard drinks    Comment: 4x a month   Drug use: Yes    Frequency: 1.0 times per week    Types: Marijuana     Allergies   Patient has no known allergies.   Review of Systems Review of Systems  Constitutional: Negative.   Respiratory: Negative.    Musculoskeletal: Negative.   Neurological: Negative.     Physical Exam Triage Vital Signs ED Triage Vitals  Enc Vitals Group     BP 11/08/21 1447 Marland Kitchen)  150/85     Pulse Rate 11/08/21 1447 61     Resp 11/08/21 1447 16     Temp 11/08/21 1447 98.4 F (36.9 C)     Temp Source 11/08/21 1447 Oral     SpO2 11/08/21 1447 96 %     Weight --      Height --      Head Circumference --      Peak Flow --      Pain Score 11/08/21 1446 3     Pain Loc --      Pain Edu? --      Excl. in [place]? --    No data found.  Updated Vital Signs BP (!) 150/85 (BP Location: Right Arm)    Pulse 61    Temp 98.4 F (36.9 C) (Oral)    Resp 16    SpO2 96%   Visual Acuity Right Eye Distance:   Left Eye Distance:   Bilateral Distance:    Right Eye Near:   Left Eye Near:    Bilateral Near:     Physical Exam Constitutional:      Appearance: Normal appearance. He is normal weight.  HENT:     Head: Normocephalic.       Comments: 2 x 3 immature abscess present on the left side of chin Eyes:     Extraocular Movements: Extraocular movements intact.  Pulmonary:     Effort: Pulmonary effort is normal.  Neurological:     Mental Status: He is alert and oriented to person, place, and time. Mental status is at baseline.  Psychiatric:        Mood and Affect: Mood normal.        Behavior: Behavior normal.     UC Treatments / Results  Labs (all labs ordered are listed, but only abnormal results are displayed) Labs Reviewed - No data to display  EKG   Radiology No results found.  Procedures Procedures (including critical care time)  Medications Ordered in UC Medications - No data to display  Initial Impression / Assessment and Plan / UC Course  I have reviewed the triage vital signs and the nursing notes.  Pertinent labs & imaging results that were available during my care of the patient were reviewed by me and considered in my medical decision making (see chart for details).  Abscess of chin  Etiology of symptom most likely caused by an ingrown hair as abscess is present within the patient's beard, abscess is firm incision and drainage at this time, discussed with patient, doxycycline 100 mg twice a day for 7 days prescribed, recommended warm compresses to the affected area at least 4 times a day, given strict return precautions for nonhealing nondraining site to return urgent care Final Clinical Impressions(s) / UC Diagnoses   Final diagnoses:  Abscess of chin     Discharge Instructions      Take doxycyline twice  a day for 7 days  You may ibuprofen as needed every 8 hours fir pain in addition to tylenol or by itself    Hold warm-hot compresses to affected area at least 4 times a day, this helps to facilitate draining, the more the better  Please return for evaluation for increased swelling, increased tenderness or pain, non healing site, non draining site, you begin to have fever or  chills   We reviewed the etiology of recurrent abscesses of skin.  Skin abscesses are collections of pus within the dermis and deeper skin tissues.  Skin abscesses manifest as painful, tender, fluctuant, and erythematous nodules, frequently surmounted by a pustule and surrounded by a rim of erythematous swelling.  Spontaneous drainage of purulent material may occur.  Fever can occur on occasion.    -Skin abscesses can develop in healthy individuals with no predisposing conditions other than skin or nasal carriage of Staphylococcus aureus.  Individuals in close contact with others who have active infection with skin abscesses are at increased risk which is likely to explain why twin brother has similar episodes.   In addition, any process leading to a breach in the skin barrier can also predispose to the development of a skin abscesses, such as atopic dermatitis.      ED Prescriptions     Medication Sig Dispense Auth. Provider   doxycycline (VIBRAMYCIN) 100 MG capsule Take 1 capsule (100 mg total) by mouth 2 (two) times daily. 14 capsule Kimm Ungaro R, NP   ibuprofen (ADVIL) 800 MG tablet Take 1 tablet (800 mg total) by mouth 3 (three) times daily. 21 tablet Jammie Clink, Leitha Schuller, NP      PDMP not reviewed this encounter.   Hans Eden, NP 11/08/21 1545

## 2021-12-06 ENCOUNTER — Telehealth: Payer: Self-pay

## 2021-12-06 NOTE — Telephone Encounter (Signed)
Patient accepts appointment for 2nd [place] vaccination. Appointment information confirmed by patient via Mychart with date/time and location. Jonathan White

## 2021-12-10 ENCOUNTER — Telehealth: Payer: Self-pay

## 2021-12-10 ENCOUNTER — Other Ambulatory Visit: Payer: Self-pay | Admitting: Internal Medicine

## 2021-12-10 DIAGNOSIS — Z7252 High risk homosexual behavior: Secondary | ICD-10-CM

## 2021-12-10 MED ORDER — DESCOVY 200-25 MG PO TABS: 1.0000 | ORAL_TABLET | Freq: Every day | ORAL | 0 refills | Status: DC

## 2021-12-10 NOTE — Telephone Encounter (Signed)
Pt calling requesting a refill on: emtricitabine-tenofovir AF (DESCOVY) 200-25 MG tablet. Pt missed his 4 mth f/u but did schedule today for first available 2/22. And wanted to know if he could get a short fill until that appt.   Please advise [phone]  Pharmacy: [facility] [place], [address] AT [place] [address]

## 2021-12-16 ENCOUNTER — Ambulatory Visit: Payer: Self-pay

## 2021-12-23 ENCOUNTER — Encounter: Payer: Self-pay | Admitting: Gastroenterology

## 2021-12-23 ENCOUNTER — Ambulatory Visit (INDEPENDENT_AMBULATORY_CARE_PROVIDER_SITE_OTHER): Payer: Self-pay | Admitting: Gastroenterology

## 2021-12-23 VITALS — BP 118/82 | HR 95 | Resp 16 | Ht 72.0 in | Wt 239.0 lb

## 2021-12-23 DIAGNOSIS — K601 Chronic anal fissure: Secondary | ICD-10-CM

## 2021-12-23 MED ORDER — SUTAB 1479-225-188 MG PO TABS: 1.0000 | ORAL_TABLET | Freq: Once | ORAL | 0 refills | Status: AC

## 2021-12-23 NOTE — Progress Notes (Signed)
° ° °  History of Present Illness:  Jonathan White is a very pleasant 31 year old male who I have seen for anal fissure who presents today with recurrent fissure symptoms.  When I saw him in follow up in November his symptoms had resolved.  However, about 2 weeks ago he developed abdominal pain and severe anorectal pain.  He had multiple loose stools and very painful bowel movements with small amounts of blood.  This persisted for about 2 days.  The abdominal pain and diarrhea have improved, but now he is continuing to have pain with passage of stool and intermittent bleeding.  Symptoms are not as bad as they were in the past. He denies other symptoms such as perianal swelling, discharge or drainage.   Currently stools are soft and formed.      HPI from Oct 02, 2021 Jonathan White is a very pleasant 31 year old male who I saw in clinic on September 23 for symptoms of bright red blood per rectum and painful defecation.  He was found to have a small posterior midline anal fissure on exam.  He was prescribed intra-anal nitroglycerin and daily Metamucil and he presents for follow-up today. Today, the patient reports significant improvement in his symptoms.  He has not seen any blood in his stool for about 2 weeks now.  He has had little to no pain with passage of stool in the last few weeks.  His stools are also becoming more consistent in terms of consistency and regularity.  He is still taking the nitroglycerin and fiber capsules twice a day.  He is not having any new symptoms.  Current Medications, Allergies, Past Medical History, Past Surgical History, Family History and Social History were reviewed in [employer] record.  Studies:   No results found.   Physical Exam: General: Pleasant, well developed , African American male in no acute distress Head: Normocephalic and atraumatic Eyes:  sclerae anicteric, conjunctiva pink  Ears: Normal auditory acuity Lungs: Clear throughout  to auscultation Heart: Regular rate and rhythm Abdomen: Soft, non distended, non-tender. No masses, no hepatomegaly. Normal bowel sounds Rectal: Deferred until time of colonoscopy Musculoskeletal: Symmetrical with no gross deformities  Extremities: No edema  Neurological: Alert oriented x 4, grossly nonfocal Psychological:  Alert and cooperative. 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  Although the short duration of his pain and diarrhea seem more likely to be an infectious enteritis, given the chronic fissure, I think a colonoscopy is indicated to rule out Crohn's disease as a cause of the chronic fissure.  The patient is in agreement.   Continue medical management of fissure with intra-anal nitroglycerin and daily metamucil  Chronic anal fissure - Colonoscopy - Continue nitroglycerin/Metamucil  The details, risks (including bleeding, perforation, infection, missed lesions, medication reactions and possible hospitalization or surgery if complications occur), benefits, and alternatives to colonoscopy with possible biopsy and possible polypectomy were discussed with the patient and he consents to proceed.   Jonathan Julson E. Candis Schatz, MD [facility]

## 2021-12-23 NOTE — Patient Instructions (Signed)
If you are age 33 or older, your body mass index should be between 23-30. Your Body mass index is 32.41 kg/m. If this is out of the aforementioned range listed, please consider follow up with your Primary Care Provider.  If you are age 69 or younger, your body mass index should be between 19-25. Your Body mass index is 32.41 kg/m. If this is out of the aformentioned range listed, please consider follow up with your Primary Care Provider.   You have been scheduled for a colonoscopy. Please follow written instructions given to you at your visit today.  Please pick up your prep supplies at the pharmacy within the next 1-3 days. If you use inhalers (even only as needed), please bring them with you on the day of your procedure.  The [facility] providers would like to encourage you to use [facility] to communicate with providers for non-urgent requests or questions.  Due to long hold times on the telephone, sending your provider a message by [facility] may be a faster and more efficient way to get a response.  Please allow 48 business hours for a response.  Please remember that this is for non-urgent requests.   It was a pleasure to see you today!  Thank you for trusting me with your gastrointestinal care!    Scott E.Cunningham,MD

## 2021-12-24 ENCOUNTER — Other Ambulatory Visit (HOSPITAL_COMMUNITY)
Admission: EM | Admit: 2021-12-24 | Discharge: 2021-12-25 | Disposition: A | Discharge status: Home / Self Care | Payer: BC Managed Care – PPO | Attending: Psychiatry | Admitting: Psychiatry

## 2021-12-24 DIAGNOSIS — R45851 Suicidal ideations: Secondary | ICD-10-CM | POA: Diagnosis not present

## 2021-12-24 DIAGNOSIS — F314 Bipolar disorder, current episode depressed, severe, without psychotic features: Secondary | ICD-10-CM | POA: Diagnosis present

## 2021-12-24 DIAGNOSIS — F39 Unspecified mood [affective] disorder: Secondary | ICD-10-CM | POA: Diagnosis not present

## 2021-12-24 DIAGNOSIS — F4323 Adjustment disorder with mixed anxiety and depressed mood: Secondary | ICD-10-CM

## 2021-12-24 DIAGNOSIS — Z20822 Contact with and (suspected) exposure to covid-19: Secondary | ICD-10-CM | POA: Insufficient documentation

## 2021-12-24 LAB — COMPREHENSIVE METABOLIC PANEL
ALT: 25 U/L (ref 0–44)
AST: 18 U/L (ref 15–41)
Albumin: 4.2 g/dL (ref 3.5–5.0)
Alkaline Phosphatase: 44 U/L (ref 38–126)
Anion gap: 11 (ref 5–15)
BUN: 6 mg/dL (ref 6–20)
CO2: 26 mmol/L (ref 22–32)
Calcium: 9.3 mg/dL (ref 8.9–10.3)
Chloride: 103 mmol/L (ref 98–111)
Creatinine, Ser: 0.96 mg/dL (ref 0.61–1.24)
GFR, Estimated: >60 mL/min (ref >60)
Glucose, Bld: 85 mg/dL (ref 70–99)
Potassium: 3.9 mmol/L (ref 3.5–5.1)
Sodium: 140 mmol/L (ref 135–145)
Total Bilirubin: 0.9 mg/dL (ref 0.3–1.2)
Total Protein: 8 g/dL (ref 6.5–8.1)

## 2021-12-24 LAB — POCT URINE DRUG SCREEN - MANUAL ENTRY (I-SCREEN)
POC Amphetamine UR: NOT DETECTED
POC Buprenorphine (BUP): NOT DETECTED
POC Cocaine UR: NOT DETECTED
POC Marijuana UR: POSITIVE — AB
POC Methadone UR: NOT DETECTED
POC Methamphetamine UR: NOT DETECTED
POC Morphine: NOT DETECTED
POC Oxazepam (BZO): NOT DETECTED
POC Oxycodone UR: NOT DETECTED
POC Secobarbital (BAR): NOT DETECTED

## 2021-12-24 LAB — URINALYSIS, COMPLETE (UACMP) WITH MICROSCOPIC
Bacteria, UA: NONE SEEN
Bilirubin Urine: NEGATIVE
Glucose, UA: NEGATIVE mg/dL
Ketones, ur: NEGATIVE mg/dL
Leukocytes,Ua: NEGATIVE
Nitrite: NEGATIVE
Protein, ur: NEGATIVE mg/dL
Specific Gravity, Urine: 1.02 (ref 1.005–1.030)
pH: 6.5 (ref 5.0–8.0)

## 2021-12-24 LAB — MAGNESIUM: Magnesium: 2.1 mg/dL (ref 1.7–2.4)

## 2021-12-24 LAB — CBC WITH DIFFERENTIAL/PLATELET
Abs Immature Granulocytes: 0.02 10*3/uL (ref 0.00–0.07)
Basophils Absolute: 0 10*3/uL (ref 0.0–0.1)
Basophils Relative: 1 %
Eosinophils Absolute: 0.1 10*3/uL (ref 0.0–0.5)
Eosinophils Relative: 1 %
HCT: 45.6 % (ref 39.0–52.0)
Hemoglobin: 15.1 g/dL (ref 13.0–17.0)
Immature Granulocytes: 0 %
Lymphocytes Relative: 32 %
Lymphs Abs: 2.1 10*3/uL (ref 0.7–4.0)
MCH: 31.6 pg (ref 26.0–34.0)
MCHC: 33.1 g/dL (ref 30.0–36.0)
MCV: 95.4 fL (ref 80.0–100.0)
Monocytes Absolute: 0.3 10*3/uL (ref 0.1–1.0)
Monocytes Relative: 5 %
Neutro Abs: 4.1 10*3/uL (ref 1.7–7.7)
Neutrophils Relative %: 61 %
Platelets: 285 10*3/uL (ref 150–400)
RBC: 4.78 MIL/uL (ref 4.22–5.81)
RDW: 11.4 % — ABNORMAL LOW (ref 11.5–15.5)
WBC: 6.6 10*3/uL (ref 4.0–10.5)
nRBC: 0 % (ref 0.0–0.2)

## 2021-12-24 LAB — RESP PANEL BY RT-PCR (FLU A&B, COVID) ARPGX2
Influenza A by PCR: NEGATIVE
Influenza B by PCR: NEGATIVE
SARS Coronavirus 2 by RT PCR: NEGATIVE

## 2021-12-24 LAB — HIV ANTIBODY (ROUTINE TESTING W REFLEX): HIV Screen 4th Generation wRfx: NONREACTIVE

## 2021-12-24 LAB — LIPID PANEL
Cholesterol: 155 mg/dL (ref 0–200)
HDL: 38 mg/dL — ABNORMAL LOW (ref >40)
LDL Cholesterol: 105 mg/dL — ABNORMAL HIGH (ref 0–99)
Total CHOL/HDL Ratio: 4.1 RATIO
Triglycerides: 58 mg/dL (ref <150)
VLDL: 12 mg/dL (ref 0–40)

## 2021-12-24 LAB — POC SARS CORONAVIRUS 2 AG -  ED: SARS Coronavirus 2 Ag: NEGATIVE

## 2021-12-24 LAB — HEMOGLOBIN A1C
Hgb A1c MFr Bld: 4.4 % — ABNORMAL LOW (ref 4.8–5.6)
Mean Plasma Glucose: 79.58 mg/dL

## 2021-12-24 LAB — TSH: TSH: 0.504 u[IU]/mL (ref 0.350–4.500)

## 2021-12-24 LAB — ETHANOL: Alcohol, Ethyl (B): <10 mg/dL (ref <10)

## 2021-12-24 MED ORDER — ALUM & MAG HYDROXIDE-SIMETH 200-200-20 MG/5ML PO SUSP: 30.0000 mL | ORAL | Status: DC | PRN

## 2021-12-24 MED ORDER — HYDROXYZINE HCL 25 MG PO TABS: 25.0000 mg | ORAL_TABLET | Freq: Three times a day (TID) | ORAL | Status: DC | PRN

## 2021-12-24 MED ORDER — ACETAMINOPHEN 325 MG PO TABS: 650.0000 mg | ORAL_TABLET | Freq: Four times a day (QID) | ORAL | Status: DC | PRN

## 2021-12-24 MED ORDER — MAGNESIUM HYDROXIDE 400 MG/5ML PO SUSP: 30.0000 mL | Freq: Every day | ORAL | Status: DC | PRN

## 2021-12-24 MED ORDER — TRAZODONE HCL 50 MG PO TABS: 50.0000 mg | ORAL_TABLET | Freq: Every evening | ORAL | Status: DC | PRN

## 2021-12-24 NOTE — ED Notes (Signed)
Pt is in the bed sleeping. Respirations are even and unlabored. No acute distress noted. Will continue to monitor for safety. 
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
## 2021-12-24 NOTE — ED Notes (Signed)
PT Admitted to [facility] due to worsening depression  and SI thoughts. Pt endorses passive SI with no plan. Pt denies HI, AVH.  Patient was cooperative during the admission assessment. Skin assessment complete.  Patient oriented to unit and unit rules. Meal and drinks offered to patient.  Patient verbalized agreement to treatment plans. Patient verbally contracts for safety while hospitalized. Will monitor for safety.

## 2021-12-24 NOTE — ED Notes (Signed)
Pt given [place] sandwich and juice.  Oriented to unit.  Awaiting transfer to [facility]

## 2021-12-24 NOTE — ED Notes (Addendum)
Pt currently alert and oriented.  Has flat sad affect and poor eye contact. Pt was searched with no contraband found.  EKG and labs performed.  Currently he is in assessment room with family member.

## 2021-12-24 NOTE — ED Provider Notes (Signed)
Behavioral Health Admission H&P [facility] & OBS)  Date: 12/24/21 Patient Name: Jonathan White MRN: [id number] Chief Complaint: No chief complaint on file.     Diagnoses:  Final diagnoses:  Bipolar disorder, current episode depressed, severe, without psychotic features ([place])    HPI: patient presented to [facility] as a walk in alone with complaints of increased depression, anxiety, and suicidal ideation.  Jonathan White, 31 y.o., male patient seen face to face by this provider, consulted with Dr. Serafina Mitchell; and chart reviewed on 12/24/21.  On evaluation Jonathan White reports he has a psychiatric history of bipolar affective disorder, depression, anxiety, and ADHD.  Reports he does not take any medications but has been prescribed Strattera in the past for ADHD.  He is not interested in medication management at this time states, "I do not like to rely on medications they make me feel numb".  He reports one inpatient psychiatric admission when he was roughly 31 years of age, and one suicide attempt.  He has outpatient counseling in place through [facility] but has not seen her for few months.    Patient reports he is in a polyamorous relationship with his husband.  They have an open marriage and they are allowed to see other people.  Each relationship has its own set of rules.  Patient has a boyfriend that was also seeing someone else.  Patient felt betrayed that he did not disclose he was seeing someone else and ended that relationship. He is upset because he depended on that relationship for his emotional support.  Reports it has been stressful keeping up with the relationships.  After he ended the relationship with boyfriend all of his feelings he had a flood of emotions. He reached out to his counselor today and she recommended he come to [place] for an assessment.  During evaluation Jonathan White is in sitting position) in no acute distress.  He has his head in his hands and is  staring at the floor.  He is visibly anxious.  He makes minimal eye contact.  His speech is clear, coherent, normal rate and tone.  He reports a decrease in sleep and appetite.  He endorses depression with feelings of hopelessness, helplessness, low motivation and decreased focus.  Objectively he does not appear to be responding to internal/external stimuli.  He denies AVH and HI.  He endorses suicidal ideations without a plan or intent.  Reports having thoughts of "I do not want to be here".   He has a history of self-harm/cutting 12 years ago and has recently had the urge to cut.  States, "cutting can help soothe me".  He admits that he is afraid he will stop getting due to his current state.  Discussed facility base crisis admission with patient.  Explained the milieu and expectations.  Explained COVID testing, lab work, and EKG.  Patient is in agreement.  Patient's spouse was able to make it to [place] while patient was still in the assessment room and he was able to visit before being brought onto the unit.      PHQ 2-9:  [place] ED from 12/24/2021 in [facility] Counselor from 02/04/2016 in [place]  Thoughts that you would be better off dead, or of hurting yourself in some way Several days Nearly every day  PHQ-9 Total Score 13 13       [place] ED from 12/24/2021 in [facility] ED from 11/08/2021 in [facility] [place] High Risk No Risk        Total Time spent with patient: 30 minutes  Musculoskeletal  Strength & Muscle Tone: within normal limits Gait & Station: normal Patient leans: N/A  Psychiatric Specialty Exam  Presentation General Appearance: Appropriate for Environment; Casual  Eye Contact:Minimal  Speech:Clear and Coherent; Normal Rate  Speech Volume:Normal  Handedness:Right   Mood and Affect  Mood:Anxious;  Depressed; Hopeless  Affect:Tearful; Congruent; Depressed   Thought Process  Thought Processes:Coherent  Descriptions of Associations:Intact  Orientation:Full (Time, Place and Person)  Thought Content:Logical  Diagnosis of Schizophrenia or Schizoaffective disorder in past: No   Hallucinations:Hallucinations: None  Ideas of Reference:None  Suicidal Thoughts:Suicidal Thoughts: Yes, Passive SI Passive Intent and/or Plan: Without Intent; Without Plan; Without Means to Carry Out  Homicidal Thoughts:Homicidal Thoughts: No   Sensorium  Memory:Immediate Good; Recent Good; Remote Good  Judgment:Fair  Insight:Fair   Executive Functions  Concentration:Good  Attention Span:Good  Recall:Good  Fund of Knowledge:Good  Language:Good   Psychomotor Activity  Psychomotor Activity:Psychomotor Activity: Normal   Assets  Assets:Communication Skills; Desire for Improvement; Housing; Physical Health; Social Support; Vocational/Educational   Sleep  Sleep:Sleep: Poor Number of Hours of Sleep: 5   No data recorded  Physical Exam Vitals and nursing note reviewed.  Constitutional:      Appearance: Normal appearance. He is well-developed.  HENT:     Head: Normocephalic and atraumatic.  Eyes:     General:        Right eye: No discharge.        Left eye: No discharge.     Conjunctiva/sclera: Conjunctivae normal.  Cardiovascular:     Rate and Rhythm: Normal rate.  Pulmonary:     Effort: Pulmonary effort is normal. No respiratory distress.  Musculoskeletal:        General: Normal range of motion.     Cervical back: Normal range of motion.  Skin:    Coloration: Skin is not jaundiced or pale.  Neurological:     Mental Status: He is alert and oriented to person, place, and time.  Psychiatric:        Attention and Perception: Attention and perception normal.        Mood and Affect: Mood is anxious and depressed. Affect is tearful.        Speech: Speech normal.         Behavior: Behavior is cooperative.        Thought Content: Thought content includes suicidal ideation. Thought content does not include suicidal plan.        Cognition and Memory: Cognition normal.        Judgment: Judgment is impulsive.   Review of Systems  Constitutional: Negative.   HENT: Negative.    Eyes: Negative.   Respiratory: Negative.    Cardiovascular: Negative.   Musculoskeletal: Negative.   Skin: Negative.   Neurological: Negative.   Psychiatric/Behavioral:  Positive for depression and suicidal ideas. The patient is nervous/anxious.    Blood pressure (!) 145/95, pulse (!) 59, temperature 97.8 F (36.6 C), temperature source Oral, resp. rate 18, SpO2 98 %. There is no height or weight on file to calculate BMI.  Past Psychiatric History: Per chart review bipolar affective disorder, depression, anxiety  Is the patient at risk to self? Yes  Has the patient been a risk to self in the past 6 months? No .    Has the  patient been a risk to self within the distant past? Yes   Is the patient a risk to others? No   Has the patient been a risk to others in the past 6 months? No   Has the patient been a risk to others within the distant past? No   Past Medical History:  Past Medical History:  Diagnosis Date   ADHD    Bipolar 1 disorder ([place])    Bipolar affective disorder ([place])    Depression     Past Surgical History:  Procedure Laterality Date   NO PAST SURGERIES      Family History:  Family History  Problem Relation Age of Onset   Hypertension Father    Stroke Father    Alcohol abuse Sister    Mental illness Sister    Schizophrenia Brother    Mental illness Brother    Colon cancer Neg Hx    Esophageal cancer Neg Hx    Pancreatic cancer Neg Hx    Stomach cancer Neg Hx    Liver disease Neg Hx     Social History:  Social History   Socioeconomic History   Marital status: Married    Spouse name: Not on file   Number of children: Not on file   Years of  education: Not on file   Highest education level: Not on file  Occupational History   Occupation: Lexicographer: [employer]  Tobacco Use   Smoking status: Never   Smokeless tobacco: Never  Vaping Use   Vaping Use: Never used  Substance and Sexual Activity   Alcohol use: Yes    Comment: 1x a month   Drug use: Yes    Frequency: 7.0 times per week    Types: Marijuana, PCP   Sexual activity: Yes    Partners: Male    Birth control/protection: Condom  Other Topics Concern   Not on file  Social History Narrative   ** Merged History Encounter **       Lives with mother   Parents divorced    Current student at [employer]   Part time job at [employer]      1 brother ,  1 sister   Brother hops around, sister lives in [place]      Father has htn   No cancer   Social Determinants of Radio broadcast assistant Strain: Not on file  Food Insecurity: Not on file  Transportation Needs: Not on file  Physical Activity: Not on file  Stress: Not on file  Social Connections: Not on file  Intimate Partner Violence: Not on file    SDOH:  SDOH Screenings   Alcohol Screen: Not on file  Depression (PHQ2-9): Medium Risk   PHQ-2 Score: 13  Financial Resource Strain: Not on file  Food Insecurity: Not on file  Housing: Not on file  Physical Activity: Not on file  Social Connections: Not on file  Stress: Not on file  Tobacco Use: Low Risk    Smoking Tobacco Use: Never   Smokeless Tobacco Use: Never   Passive Exposure: Not on file  Transportation Needs: Not on file    Last Labs:  Admission on 12/24/2021  Component Date Value Ref Range Status   SARS Coronavirus 2 by RT PCR 12/24/2021 NEGATIVE  NEGATIVE Final   Comment: (NOTE) SARS-CoV-2 target nucleic acids are NOT DETECTED.  The SARS-CoV-2 RNA is generally detectable in upper respiratory specimens during the acute phase of infection. The lowest  concentration of SARS-CoV-2 viral copies this assay can detect is 138  copies/mL. A negative result does not preclude SARS-Cov-2 infection and should not be used as the sole basis for treatment or other patient management decisions. A negative result may occur with  improper specimen collection/handling, submission of specimen other than nasopharyngeal swab, presence of viral mutation(s) within the areas targeted by this assay, and inadequate number of viral copies(<138 copies/mL). A negative result must be combined with clinical observations, patient history, and epidemiological information. The expected result is Negative.  Fact Sheet for Patients:  EntrepreneurPulse.com.au  Fact Sheet for Healthcare Providers:  IncredibleEmployment.be  This test is no                          t yet approved or cleared by the [place] FDA and  has been authorized for detection and/or diagnosis of SARS-CoV-2 by FDA under an Emergency Use Authorization (EUA). This EUA will remain  in effect (meaning this test can be used) for the duration of the COVID-19 declaration under Section 564(b)(1) of the Act, 21 U.S.C.section 360bbb-3(b)(1), unless the authorization is terminated  or revoked sooner.       Influenza A by PCR 12/24/2021 NEGATIVE  NEGATIVE Final   Influenza B by PCR 12/24/2021 NEGATIVE  NEGATIVE Final   Comment: (NOTE) The Xpert Xpress SARS-CoV-2/FLU/RSV plus assay is intended as an aid in the diagnosis of influenza from Nasopharyngeal swab specimens and should not be used as a sole basis for treatment. Nasal washings and aspirates are unacceptable for Xpert Xpress SARS-CoV-2/FLU/RSV testing.  Fact Sheet for Patients: EntrepreneurPulse.com.au  Fact Sheet for Healthcare Providers: IncredibleEmployment.be  This test is not yet approved or cleared by the [place] FDA and has been authorized for detection and/or diagnosis of SARS-CoV-2 by FDA under an Emergency Use Authorization  (EUA). This EUA will remain in effect (meaning this test can be used) for the duration of the COVID-19 declaration under Section 564(b)(1) of the Act, 21 U.S.C. section 360bbb-3(b)(1), unless the authorization is terminated or revoked.  Performed at [facility], [place] [address], [place], [place] [address]    SARS Coronavirus 2 Ag 12/24/2021 Negative  Negative Final   WBC 12/24/2021 6.6  4.0 - 10.5 K/uL Final   RBC 12/24/2021 4.78  4.22 - 5.81 MIL/uL Final   Hemoglobin 12/24/2021 15.1  13.0 - 17.0 g/dL Final   HCT 12/24/2021 45.6  39.0 - 52.0 % Final   MCV 12/24/2021 95.4  80.0 - 100.0 fL Final   MCH 12/24/2021 31.6  26.0 - 34.0 pg Final   MCHC 12/24/2021 33.1  30.0 - 36.0 g/dL Final   RDW 12/24/2021 11.4 (L)  11.5 - 15.5 % Final   Platelets 12/24/2021 285  150 - 400 K/uL Final   nRBC 12/24/2021 0.0  0.0 - 0.2 % Final   Neutrophils Relative % 12/24/2021 61  % Final   Neutro Abs 12/24/2021 4.1  1.7 - 7.7 K/uL Final   Lymphocytes Relative 12/24/2021 32  % Final   Lymphs Abs 12/24/2021 2.1  0.7 - 4.0 K/uL Final   Monocytes Relative 12/24/2021 5  % Final   Monocytes Absolute 12/24/2021 0.3  0.1 - 1.0 K/uL Final   Eosinophils Relative 12/24/2021 1  % Final   Eosinophils Absolute 12/24/2021 0.1  0.0 - 0.5 K/uL Final   Basophils Relative 12/24/2021 1  % Final   Basophils Absolute 12/24/2021 0.0  0.0 - 0.1 K/uL Final  Immature Granulocytes 12/24/2021 0  % Final   Abs Immature Granulocytes 12/24/2021 0.02  0.00 - 0.07 K/uL Final   Performed at [facility], [place] [address], [place], [place] [address]   Sodium 12/24/2021 140  135 - 145 mmol/L Final   Potassium 12/24/2021 3.9  3.5 - 5.1 mmol/L Final   Chloride 12/24/2021 103  98 - 111 mmol/L Final   CO2 12/24/2021 26  22 - 32 mmol/L Final   Glucose, Bld 12/24/2021 85  70 - 99 mg/dL Final   Glucose reference range applies only to samples taken after fasting for at least 8 hours.   BUN 12/24/2021 6  6 - 20 mg/dL Final    Creatinine, Ser 12/24/2021 0.96  0.61 - 1.24 mg/dL Final   Calcium 12/24/2021 9.3  8.9 - 10.3 mg/dL Final   Total Protein 12/24/2021 8.0  6.5 - 8.1 g/dL Final   Albumin 12/24/2021 4.2  3.5 - 5.0 g/dL Final   AST 12/24/2021 18  15 - 41 U/L Final   ALT 12/24/2021 25  0 - 44 U/L Final   Alkaline Phosphatase 12/24/2021 44  38 - 126 U/L Final   Total Bilirubin 12/24/2021 0.9  0.3 - 1.2 mg/dL Final   GFR, Estimated 12/24/2021 >60  >60 mL/min Final   Comment: (NOTE) Calculated using the CKD-EPI Creatinine Equation (2021)    Anion gap 12/24/2021 11  5 - 15 Final   Performed at [place] [address] [place], [place] [address]   Hgb A1c MFr Bld 12/24/2021 4.4 (L)  4.8 - 5.6 % Final   Comment: (NOTE) Pre diabetes:          5.7%-6.4%  Diabetes:              >6.4%  Glycemic control for   <7.0% adults with diabetes    Mean Plasma Glucose 12/24/2021 79.58  mg/dL Final   Performed at [facility], [place] [address], [place], [place] [address]   Magnesium 12/24/2021 2.1  1.7 - 2.4 mg/dL Final   Performed at [place] [address], [place], [place] [address]   Alcohol, Ethyl (B) 12/24/2021 <10  <10 mg/dL Final   Comment: (NOTE) Lowest detectable limit for serum alcohol is 10 mg/dL.  For medical purposes only. Performed at [facility], [place] [address], [place], [place] [address]    Cholesterol 12/24/2021 155  0 - 200 mg/dL Final   Triglycerides 12/24/2021 58  <150 mg/dL Final   HDL 12/24/2021 38 (L)  >40 mg/dL Final   Total CHOL/HDL Ratio 12/24/2021 4.1  RATIO Final   VLDL 12/24/2021 12  0 - 40 mg/dL Final   LDL Cholesterol 12/24/2021 105 (H)  0 - 99 mg/dL Final   Comment:        Total Cholesterol/HDL:CHD Risk Coronary Heart Disease Risk Table                     Men   Women  1/2 Average Risk   3.4   3.3  Average Risk       5.0   4.4  2 X Average Risk   9.6   7.1  3 X Average Risk  23.4   11.0        Use the calculated Patient Ratio above and the CHD  Risk Table to determine the patient's CHD Risk.        ATP III CLASSIFICATION (LDL):  <100  mg/dL   Optimal  100-129  mg/dL   Near or Above                    Optimal  130-159  mg/dL   Borderline  160-189  mg/dL   High  >190     mg/dL   Very High Performed at [place] [address] [place], [place] [address]    TSH 12/24/2021 0.504  0.350 - 4.500 uIU/mL Final   Comment: Performed by a 3rd Generation assay with a functional sensitivity of <=0.01 uIU/mL. Performed at [facility], [place] [address], [place], [place] [address]    Color, Urine 12/24/2021 YELLOW  YELLOW Final   APPearance 12/24/2021 CLEAR  CLEAR Final   Specific Gravity, Urine 12/24/2021 1.020  1.005 - 1.030 Final   pH 12/24/2021 6.5  5.0 - 8.0 Final   Glucose, UA 12/24/2021 NEGATIVE  NEGATIVE mg/dL Final   Hgb urine dipstick 12/24/2021 TRACE (A)  NEGATIVE Final   Bilirubin Urine 12/24/2021 NEGATIVE  NEGATIVE Final   Ketones, ur 12/24/2021 NEGATIVE  NEGATIVE mg/dL Final   Protein, ur 12/24/2021 NEGATIVE  NEGATIVE mg/dL Final   Nitrite 12/24/2021 NEGATIVE  NEGATIVE Final   Leukocytes,Ua 12/24/2021 NEGATIVE  NEGATIVE Final   Squamous Epithelial / LPF 12/24/2021 0-5  0 - 5 Final   WBC, UA 12/24/2021 0-5  0 - 5 WBC/hpf Final   RBC / HPF 12/24/2021 0-5  0 - 5 RBC/hpf Final   Bacteria, UA 12/24/2021 NONE SEEN  NONE SEEN Final   Mucus 12/24/2021 PRESENT   Final   Performed at [facility], [place] [address] [place], [place] [address]   POC Amphetamine UR 12/24/2021 None Detected  NONE DETECTED (Cut Off Level 1000 ng/mL) Final   POC Secobarbital (BAR) 12/24/2021 None Detected  NONE DETECTED (Cut Off Level 300 ng/mL) Final   POC Buprenorphine (BUP) 12/24/2021 None Detected  NONE DETECTED (Cut Off Level 10 ng/mL) Final   POC Oxazepam (BZO) 12/24/2021 None Detected  NONE DETECTED (Cut Off Level 300 ng/mL) Final   POC Cocaine UR 12/24/2021 None Detected  NONE DETECTED (Cut Off Level 300 ng/mL) Final   POC  Methamphetamine UR 12/24/2021 None Detected  NONE DETECTED (Cut Off Level 1000 ng/mL) Final   POC Morphine 12/24/2021 None Detected  NONE DETECTED (Cut Off Level 300 ng/mL) Final   POC Oxycodone UR 12/24/2021 None Detected  NONE DETECTED (Cut Off Level 100 ng/mL) Final   POC Methadone UR 12/24/2021 None Detected  NONE DETECTED (Cut Off Level 300 ng/mL) Final   POC Marijuana UR 12/24/2021 Positive (A)  NONE DETECTED (Cut Off Level 50 ng/mL) Final    Allergies: Patient has no known allergies.  PTA Medications: (Not in a hospital admission)   Medical Decision Making  Patient presents to the [facility] with increased depression, anxiety and SI.  He is SI without a plan or intent, however he cannot contract for safety.  He has agreed to be admitted into the facility base crisis unit for treatment. Recommendations  Based on my evaluation the patient does not appear to have an emergency medical condition.  Patient meets criteria for treatment in the [facility].  Lab work ordered CBC with differential, CMP, ethanol, hemoglobin A1c, HIV antibody, lipid TSH, magnesium, RPR, UA, UDS, GC chlamydia, COVID POC and PCR.  EKG ordered   Revonda Humphrey, NP 12/24/21  9:38 PM

## 2021-12-24 NOTE — Progress Notes (Signed)
°   12/24/21 1225  [place] [facility] (Walk-ins at [facility] only)  How Did You Hear About [place]? Self  What Is the Reason for Your Visit/Call Today? Patient presents reporting worsening depression following a break up due to an affair. Patient shares he is an polyamorous relationship, stating he has a husband and a boyfriend.  Each relationship has a separate agreement, with all parties in agreement with relationship guidelines.  Patient states the boyfriend did not disclose a separate relationship he was having, as they had agreed upon.  Patient feels the communication problems have been the biggest issue.  He states his husband is handling things okay and they are "getting back into a rhythm."  Patient reports having suicidal thoughts, feeling he doesn't want to be here and "having to push through daily."  He states he can usually supress the urge to attempt.  He is now cosidering cutting for "soothing," however he is concerned he may not stop given his current thoughts and hopelessness.  He denies HI and AVH.  He admits to daily THC use.  How Long Has This Been Causing You Problems? 1 wk - 1 month  Have You Recently Had Any Thoughts About Hurting Yourself? Yes  How long ago did you have thoughts about hurting yourself? Today, urge to cut for "soothing" however concerned he may not stop cutting.  Are You Planning to Commit Suicide/Harm Yourself At This time? Yes (cutting, fears of cutting and not stopping)  Have you Recently Had Thoughts About [place]? No  Are You Planning To Harm Someone At This Time? No  Are you currently experiencing any auditory, visual or other hallucinations? No  Have You Used Any Alcohol or Drugs in the Past 24 Hours? Yes  How long ago did you use Drugs or Alcohol? THC use few times daily  What Did You Use and How Much? THC  Do you have any current medical co-morbidities that require immediate attention? No  Clinician description of patient physical  appearance/behavior: Tearful, depressed, cooperative  What Do You Feel Would Help You the Most Today? Treatment for Depression or other mood problem  If access to [facility] was not available, would you have sought care in the Emergency Department? Yes  Determination of Need Urgent (48 hours)  Options For Referral Inpatient Hospitalization;Facility-Based Crisis;Intensive Outpatient Therapy

## 2021-12-24 NOTE — ED Notes (Signed)
Patient attended AA group and gave positive feedback while interacting in group. Staff commended patient for interacting and communicating with others in group.

## 2021-12-24 NOTE — Progress Notes (Signed)
Received Jonathan White from the OBS area asleep in his bed. He was cooperative with the admission process. He was offered and refused to retrieved numbers from his phone. He stated feeling less depressed and anxious. He stated the thoughts of suicide are going away and he denied hearing voices.

## 2021-12-25 LAB — RPR: RPR Ser Ql: NONREACTIVE

## 2021-12-25 NOTE — ED Provider Notes (Addendum)
FBC/OBS ASAP Discharge Summary  Date and Time: 12/25/2021 12:23 PM  Name: Jonathan White  MRN:  [id number]   Discharge Diagnoses:  Final diagnoses:  Mood disorder ([place])  Adjustment disorder with mixed anxiety and depressed mood    Subjective:   Patient seen and chart reviewed-he has been improved mood with peers and staff on the unit.  Patient interviewed this morning, he is calm and cooperative and pleasant.  Patient found attending groups this morning and was agreeable to ask the group early in order to speak with MD.  Patient recounts what led to hospitalization as per H&P.  Patient goes on to state that his boyfriend has been having an affair for about 2 months and that they broke up he had text this Sunday.  Patient indicates that they had communicated earlier that week and that patient had requested that his boyfriend not talk to the person that he had an affair with.  Patient states that the boyfriend was not going to stop seeing this individual and this resulted in termination of the relationship. Goes on to discuss that since the termination of the relationship that he has "been dealing with a lot of grief and regulating my emotions around it" (referring to the termination of the relationship)  Patient denies active SI plan or intent.  Patient describes passive SI.  Patient goes on to state that "I have scenarios in my head" and describes visualizing situations where he will slit his wrist in the bathtub but indicates that he does not have intent or plan to act in such a way and cites his husband, job, and friends as protective factors..  Patient reports 1 suicide attempt in the past when he was a teenager; however, he immediately states that it was "for attention".  Patient states that he took some ibuprofen and then "got sick".  Patient reports that he is engaged in an SSI be all that has not done so in over 10 years.  Patient describes cutting behavior as "soothing".  Patient states that  he was seeing a therapist for about a year and that they would meet weekly.  Patient states that his mood began to improve and he started meeting with his therapist approximately every 2 months.  Patient reported to socal work that he has an upcoming appointment with his therapist on Monday.  .  Patient declined starting any medications while he is at the [facility] and states that he has tried multiple medications before and that they had not been particularly helpful.  Patient states "I just found out I have ADHD. I am trying to get testing for autism spectrum disorder".  Patient goes on to state that he is not open to starting medications at this time until he completes this testing so he can be sure that he is on the correct medications.  Patient had reported to LCSW earlier this morning that he would be interested in leaving the [facility] today.  When discharge planning is discussed, patient states "I can see myself being safe if I can get support" and states that he would like information on "support groups".  Patient describes having a large support system consisting of husband and his friends.  Patient provides consent for LCSW and MD to speak with his husband.  Jonathan White (husband) [phone] Jonathan White was contacted in conjunction with LCSW. He reports he was aware that Jonathan White was at the [facility] and that he has "been having some issues".  He states that  the patient was recommended by his therapist to present to the [facility] to be evaluated.  When he is informed that Jonathan White is asking to leave and if he has any safety concerns, Jonathan White states "if he says he is okay, I trust him on that".  He denies any immediate safety concerns but wonders what he will need to know in order to best help him moving forward.  Safety planning done with patient's husband and he is advised to remove any weapons, secure sharps, medications and other items that could be used for self-harm.  Jonathan White requests information about support  groups for those helping family members with mental health.  He states that he is already aware of N[place], LCSW advises that he contact the mental health [place] as a half support groups.  Jonathan White verbalized understanding and was in agreement.  He is also informed that Jonathan White will be provided with resources for groups as that is something he requested.  All questions answered.  Patient is not an imminent risk to self, others, or psychotic-he does not meet criteria for IVC.  As patient is requesting discharge, he will to be discharged per [facility] request with resources.  Stay Summary:  Jonathan White reports he has a psychiatric history of bipolar affective disorder, depression, anxiety, and ADHD who presented to the [facility] on 1/31 for passive SI and depressive sx in the context of the termination of a relationship. Patient was agreeable to [facility] admission. Patient declined medications during hist stay at [facility]. The following day , 2/1, patient as able to contract for safety and requested discharge. See above for additional information. Collateral was contacted with consent and no immediate safety concerns reported. Patient displayed ssx of cluster B traits-likely would benefit from CBT and information provided in AVS with the assistance of SW.   Total Time spent with patient: 20 minutes  Past Psychiatric History: ADHD, bipolar disorder Past Medical History:  Past Medical History:  Diagnosis Date   ADHD    Bipolar 1 disorder ([place])    Bipolar affective disorder ([place])    Depression     Past Surgical History:  Procedure Laterality Date   NO PAST SURGERIES     Family History:  Family History  Problem Relation Age of Onset   Hypertension Father    Stroke Father    Alcohol abuse Sister    Mental illness Sister    Schizophrenia Brother    Mental illness Brother    Colon cancer Neg Hx    Esophageal cancer Neg Hx    Pancreatic cancer Neg Hx    Stomach cancer Neg Hx    Liver  disease Neg Hx    Family Psychiatric History: brother with schizophrenia, sister with Alcohol use per chart review Social History:  Social History   Substance and Sexual Activity  Alcohol Use Yes   Comment: 1x a month     Social History   Substance and Sexual Activity  Drug Use Yes   Frequency: 7.0 times per week   Types: Marijuana, PCP    Social History   Socioeconomic History   Marital status: Married    Spouse name: Not on file   Number of children: Not on file   Years of education: Not on file   Highest education level: Not on file  Occupational History   Occupation: Ship broker    Employer: [employer]  Tobacco Use   Smoking status: Never   Smokeless tobacco: Never  Vaping Use   Vaping Use: Never used  Substance and Sexual Activity   Alcohol use: Yes    Comment: 1x a month   Drug use: Yes    Frequency: 7.0 times per week    Types: Marijuana, PCP   Sexual activity: Yes    Partners: Male    Birth control/protection: Condom  Other Topics Concern   Not on file  Social History Narrative   ** Merged History Encounter **       Lives with mother   Parents divorced    Current student at [employer]   Part time job at [employer]      1 brother ,  1 sister   Brother hops around, sister lives in [place]      Father has htn   No cancer   Social Determinants of Radio broadcast assistant Strain: Not on file  Food Insecurity: Not on file  Transportation Needs: Not on file  Physical Activity: Not on file  Stress: Not on file  Social Connections: Not on file   SDOH:  SDOH Screenings   Alcohol Screen: Not on file  Depression (PHQ2-9): Medium Risk   PHQ-2 Score: 13  Financial Resource Strain: Not on file  Food Insecurity: Not on file  Housing: Not on file  Physical Activity: Not on file  Social Connections: Not on file  Stress: Not on file  Tobacco Use: Low Risk    Smoking Tobacco Use: Never   Smokeless Tobacco Use: Never   Passive Exposure: Not on file   Transportation Needs: Not on file    Tobacco Cessation:  N/A, patient does not currently use tobacco products  Current Medications:  Current Facility-Administered Medications  Medication Dose Route Frequency Provider Last Rate Last Admin   acetaminophen (TYLENOL) tablet 650 mg  650 mg Oral Q6H PRN Revonda Humphrey, NP       alum & mag hydroxide-simeth (MAALOX/MYLANTA) 200-200-20 MG/5ML suspension 30 mL  30 mL Oral Q4H PRN Revonda Humphrey, NP       hydrOXYzine (ATARAX) tablet 25 mg  25 mg Oral TID PRN Revonda Humphrey, NP       magnesium hydroxide (MILK OF MAGNESIA) suspension 30 mL  30 mL Oral Daily PRN Revonda Humphrey, NP       traZODone (DESYREL) tablet 50 mg  50 mg Oral QHS PRN Revonda Humphrey, NP       Current Outpatient Medications  Medication Sig Dispense Refill   atomoxetine (STRATTERA) 40 MG capsule Take 1 capsule (40 mg total) by mouth 2 (two) times daily with a meal. 180 capsule 1   Calcium Polycarbophil (FIBER-CAPS PO) Take 1 capsule by mouth in the morning and at bedtime.     emtricitabine-tenofovir AF (DESCOVY) 200-25 MG tablet Take 1 tablet by mouth daily. 30 tablet 0    PTA Medications: (Not in a hospital admission)   Musculoskeletal  Strength & Muscle Tone: within normal limits Gait & Station: normal Patient leans: N/A  Psychiatric Specialty Exam  Presentation  General Appearance: Appropriate for Environment; Casual  Eye Contact:Fair  Speech:Clear and Coherent; Normal Rate  Speech Volume:Normal  Handedness:Right   Mood and Affect  Mood:Anxious; Dysphoric  Affect:Appropriate; Congruent; Tearful; Other (comment) (intermittently tearful; appropriate to situation)   Thought Process  Thought Processes:Coherent; Goal Directed; Linear  Descriptions of Associations:Intact  Orientation:Full (Time, Place and Person)  Thought Content:Logical; Rumination (ruminitive about relationship with boyfriend)  Diagnosis of Schizophrenia or  Schizoaffective disorder in past:  No    Hallucinations:Hallucinations: None  Ideas of Reference:None  Suicidal Thoughts:Suicidal Thoughts: Yes, Passive SI Passive Intent and/or Plan: Without Intent; Without Plan  Homicidal Thoughts:Homicidal Thoughts: No   Sensorium  Memory:Immediate Good; Recent Good; Remote Good  Judgment:Fair  Insight:Fair   Executive Functions  Concentration:Good  Attention Span:Good  Recall:Good  Fund of Knowledge:Good  Language:Good   Psychomotor Activity  Psychomotor Activity:Psychomotor Activity: Normal   Assets  Assets:Communication Skills; Desire for Improvement; Housing; Intimacy; Physical Health; Resilience; Social Support; Talents/Skills; Transportation   Sleep  Sleep:Sleep: [place] Number of Hours of Sleep: 5   No data recorded  Physical Exam  Physical Exam Constitutional:      Appearance: Normal appearance. He is normal weight.  HENT:     Head: Normocephalic and atraumatic.  Eyes:     Extraocular Movements: Extraocular movements intact.  Pulmonary:     Effort: Pulmonary effort is normal.  Neurological:     General: No focal deficit present.     Mental Status: He is alert and oriented to person, place, and time.  Psychiatric:        Attention and Perception: Attention and perception normal.        Speech: Speech normal.        Behavior: Behavior normal. Behavior is cooperative.        Thought Content: Thought content normal.   Review of Systems  Constitutional:  Negative for chills and fever.  HENT:  Negative for hearing loss.   Eyes:  Negative for discharge and redness.  Respiratory:  Negative for cough.   Cardiovascular:  Negative for chest pain.  Gastrointestinal:  Negative for abdominal pain.  Musculoskeletal:  Negative for myalgias.  Neurological:  Negative for headaches.  Psychiatric/Behavioral:  Positive for depression and suicidal ideas. Negative for substance abuse.        Passive SI without intent or  plan  Blood pressure (!) 134/93, pulse 63, temperature (!) 97.3 F (36.3 C), temperature source Temporal, resp. rate 18, SpO2 100 %. There is no height or weight on file to calculate BMI.  Demographic Factors:  Male and Abner Greenspan, lesbian, or bisexual orientation  Loss Factors: Loss of significant relationship  Historical Factors: Prior suicide attempts, Family history of mental illness or substance abuse, and Impulsivity  Risk Reduction Factors:   Sense of responsibility to family, Employed, Living with another person, especially a relative, and Positive social support  Continued Clinical Symptoms:  Depression:   Anhedonia Impulsivity Previous Psychiatric Diagnoses and Treatments  Cognitive Features That Contribute To Risk:  Thought constriction (tunnel vision)    Suicide Risk:  Mild:  Suicidal ideation of limited frequency, intensity, duration, and specificity.  There are no identifiable plans, no associated intent, mild dysphoria and related symptoms, good self-control (both objective and subjective assessment), few other risk factors, and identifiable protective factors, including available and accessible social support.  Plan Of Care/Follow-up recommendations:  Activity:  as tolerated Diet:  regular Other:     Take all medications as prescribed by his/her mental healthcare provider. Report any adverse effects and or reactions from the medicines to your outpatient provider promptly. Do not engage in alcohol and or illegal drug use while on prescription medicines. In the event of worsening symptoms, call the crisis hotline, 911 and or go to the nearest ED for appropriate evaluation and treatment of symptoms. follow-up with your primary care provider for your other medical issues, concerns and or health care needs.    Disposition: self care/home  Safety Plan  Jonathan White will reach out to his husband, friends, call 50 or call mobile crisis, or go to nearest emergency  room if condition worsens or if suicidal thoughts become active Patients' will follow up with regular outpatient counselor  for outpatient psychiatric services (therapy/medication management).  The suicide prevention education provided includes the following: Suicide risk factors Suicide prevention and interventions National Suicide Hotline telephone number [facility] assessment telephone number [place] and/or Residential Mobile Crisis Unit telephone number Request made of family/significant other to:  husband Remove weapons (e.g., guns, rifles, knives), all items previously/currently identified as safety concern.   Remove drugs/medications (over the counter, prescriptions, illicit drugs), all items previously/currently identified as a safety concern.     Ival Bible, MD 12/25/2021, 12:23 PM

## 2021-12-25 NOTE — Discharge Instructions (Addendum)
Please contact one of the following facilities to start medication management and therapy services:   [facility] [address]  [place], [place] [address] [phone]   [place]  [address] [place], [place] [address] [phone]  [place]  [address] Ignacia Marvel [place], [place] [address] [phone]  [facility]  [address]  [place], [place] [address] [phone]  [facility]  [address] [place], [place] [address] [phone]  [place]  [address]  [place], [place] [address] [phone]  Mobile Crisis Response Teams Listed by counties in vicinity of [place] providers [place]. [phone] [place] [phone] [place] [phone] [facility] [phone] [place] [phone]                * [facility] [place]. [phone] [place].  [place] [phone]

## 2021-12-25 NOTE — ED Notes (Signed)
Pt is sleeping in the bed. Respirations are even and unlabored. No acute distress noted. Will continue to monitor for safety

## 2021-12-25 NOTE — ED Notes (Signed)
Prefers to go by the name Jonathan White.  Will inform other staff of preference.

## 2021-12-25 NOTE — Clinical Social Work Psych Note (Addendum)
LCSW Initial/Discharge Note   LCSW met with "Terrell" for introduction and to begin discussions regarding treatment and potential discharge planning.   Terrell shared that he came to the [facility] seeking assistance for worsening depressive symptoms, including suicidal ideations and thoughts of self-harming behaviors such as cutting.  Enid Derry shared that he was in a polyamorous relationship with his husband and boyfriend, who is now his ex-boyfriend due to recently learning about the boyfriend being involved in another relationship. Enid Derry reports that he and his partners all had an understanding, and he was shocked and hurt to learn that his boyfriend had another partner.   He reports he continues to grieve the relationship due to his ex-boyfriend being a friend for years prior to them dating.   Enid Derry reports that he currently lives with his husband, who has been very supportive throughout this situations. Terrell reports seeing [facility] for therapy.   He reports having a follow up appointment with his therapist on Monday, 12/30/2021.   Enid Derry reports he is not sure what he wants to accomplish while on the [facility]. He asked if he could speak with the attending provider to discuss possible discharge.   According to Dr. Serafina Mitchell, MD the patient continues to request discharge. He denied wanting to start any psychiatric medications.  LCSW and Dr. Serafina Mitchell contacted [place] husband, Gustavo Lah [phone]) to discuss discharge safety planning. Terrell's husband shared that he "trust" his husband and does not believe he wil harm himself. He ensued providers that if he identifies the patient needs to seek help, he will take him to the nearest ED or back to the [facility]. He was agreeable to remove any weapons from the home.   LCSW will provide additional resources for supportive groups and additional outpatient resources.   Enid Derry reports he plans to return  home with his husband and will continue to follow up with outpatient providers.   LCSW signing off.    Radonna Ricker, MSW, LCSW Clinical Education officer, museum ([place]) [facility]

## 2021-12-25 NOTE — ED Notes (Signed)
Pt came to group and was giving a handout

## 2021-12-25 NOTE — ED Notes (Signed)
Pt discharged with  AVS.  AVS reviewed prior to discharge.  Pt alert, oriented, and ambulatory.  Safety maintained.  °

## 2021-12-25 NOTE — Clinical Social Work Psych Note (Incomplete)
Depression  Diagnosis: Depression (Psychoeducational)  Date: 12/25/21  Type of Therapy/Therapeutic Modalities: Group Discussion, Psycho-Education; CBT; Motivational Interviewing   Participation Level: {BHH PARTICIPATION SELTR:32023}  Objective: The purpose of this group is to educate patients on the various components of depression and how it can influence and/or contribute to severe symptoms that affect how you feel, think, and handle daily activities, such as sleeping, eating, or working.  Therapeutic Goals:  Patient will learn the foundations of depression, including its definition and the various types of depression disorders.  Patient will discuss with group members their personal experiences with depression and how it has impacted their lives Patient will learn various cognitive behavioral therapy (CBT) techniques that are effective in treating anxiety symptoms. Patient will discuss with group members how they plan to address and/or maintain their depressive symptoms moving forward.   Summary of Patients Progress: ***

## 2021-12-25 NOTE — ED Notes (Signed)
Pt was up late for breakfast and only drank one glass of OJ. While at breakfast table pt began to cry heavily.  Staff attempted to comfort pt.  Prn medication offered but pt refused at this time.  Pt also asked if it "would be okay if he did not take any medications today?"  Informed pt he had the right to refuse his medications but offered concern about noted tearfulness.  Denies SI, HI, and AVH. No pain reported this morning. States his mood is "rough".  When asked about sleep pt said he slept ok.  He denied taking any prn sleep aids over the night.  Will continue to monitor for safety.

## 2022-01-11 ENCOUNTER — Other Ambulatory Visit: Payer: Self-pay | Admitting: Internal Medicine

## 2022-01-11 DIAGNOSIS — Z7252 High risk homosexual behavior: Secondary | ICD-10-CM

## 2022-01-15 ENCOUNTER — Encounter: Payer: Self-pay | Admitting: Internal Medicine

## 2022-01-15 ENCOUNTER — Ambulatory Visit: Payer: BC Managed Care – PPO | Admitting: Internal Medicine

## 2022-01-15 ENCOUNTER — Other Ambulatory Visit: Payer: Self-pay

## 2022-01-15 VITALS — BP 148/98 | HR 64 | Temp 98.4°F | Resp 16 | Ht 72.0 in | Wt 238.0 lb

## 2022-01-15 DIAGNOSIS — Z7252 High risk homosexual behavior: Secondary | ICD-10-CM

## 2022-01-15 DIAGNOSIS — I10 Essential (primary) hypertension: Secondary | ICD-10-CM

## 2022-01-15 DIAGNOSIS — F4321 Adjustment disorder with depressed mood: Secondary | ICD-10-CM

## 2022-01-15 MED ORDER — DESCOVY 200-25 MG PO TABS: 1.0000 | ORAL_TABLET | Freq: Every day | ORAL | 0 refills | Status: DC

## 2022-01-15 MED ORDER — INDAPAMIDE 1.25 MG PO TABS: 1.2500 mg | ORAL_TABLET | Freq: Every day | ORAL | 0 refills | Status: DC

## 2022-01-15 NOTE — Patient Instructions (Signed)

## 2022-01-15 NOTE — Progress Notes (Signed)
Subjective:  Patient ID: Jonathan White, male    DOB: 05-06-1991  Age: 31 y.o. MRN: [id number]  CC: Hypertension  This visit occurred during the SARS-CoV-2 public health emergency.  Safety protocols were in place, including screening questions prior to the visit, additional usage of staff PPE, and extensive cleaning of exam room while observing appropriate contact time as indicated for disinfecting solutions.    HPI Jonathan White presents for f/up -   He was admitted to psych about 2 months ago after a break-up. He is seeing a therapist and has gotten much better but is not taking any meds and does not feel like he needs any meds. He denies sx's of mania but feels slightly depressed.  Outpatient Medications Prior to Visit  Medication Sig Dispense Refill   Calcium Polycarbophil (FIBER-CAPS PO) Take 1 capsule by mouth in the morning and at bedtime.     DESCOVY 200-25 MG tablet TAKE 1 TABLET BY MOUTH DAILY 30 tablet 0   No facility-administered medications prior to visit.    ROS Review of Systems  Constitutional:  Positive for unexpected weight change (wt loss). Negative for chills, diaphoresis, fatigue and fever.  HENT: Negative.    Eyes: Negative.  Negative for visual disturbance.  Respiratory:  Negative for cough, chest tightness and shortness of breath.   Cardiovascular:  Negative for chest pain, palpitations and leg swelling.  Gastrointestinal:  Negative for abdominal pain, diarrhea, nausea and vomiting.  Endocrine: Negative.   Genitourinary: Negative.   Musculoskeletal: Negative.   Skin: Negative.   Neurological:  Negative for dizziness, weakness and headaches.  Hematological:  Negative for adenopathy. Does not bruise/bleed easily.  Psychiatric/Behavioral:  Positive for dysphoric mood. Negative for agitation, confusion, decreased concentration, hallucinations, self-injury, sleep disturbance and suicidal ideas. The patient is not nervous/anxious and is not  hyperactive.    Objective:  BP (!) 148/98 (BP Location: Left Arm, Patient Position: Sitting, Cuff Size: Large)    Pulse 64    Temp 98.4 F (36.9 C) (Oral)    Resp 16    Ht 6' (1.829 m)    Wt 238 lb (108 kg)    SpO2 98%    BMI 32.28 kg/m   BP Readings from Last 3 Encounters:  01/15/22 (!) 148/98  12/23/21 118/82  11/08/21 (!) 150/85    Wt Readings from Last 3 Encounters:  01/15/22 238 lb (108 kg)  12/23/21 239 lb (108.4 kg)  10/02/21 251 lb 2 oz (113.9 kg)    Physical Exam Vitals reviewed.  HENT:     Nose: Nose normal.     Mouth/Throat:     Mouth: Mucous membranes are moist.  Eyes:     General: No scleral icterus.    Conjunctiva/sclera: Conjunctivae normal.  Cardiovascular:     Rate and Rhythm: Regular rhythm.     Heart sounds: No murmur heard. Pulmonary:     Effort: Pulmonary effort is normal.     Breath sounds: No stridor. No wheezing, rhonchi or rales.  Abdominal:     General: Abdomen is flat.     Palpations: There is no mass.     Tenderness: There is no abdominal tenderness. There is no guarding.     Hernia: No hernia is present.  Musculoskeletal:     Cervical back: Neck supple.     Right lower leg: No edema.     Left lower leg: No edema.  Lymphadenopathy:     Cervical: No cervical adenopathy.  Skin:  General: Skin is warm and dry.  Neurological:     General: No focal deficit present.     Mental Status: He is alert. Mental status is at baseline.  Psychiatric:        Attention and Perception: Attention and perception normal.        Mood and Affect: Mood is depressed. Mood is not anxious or elated. Affect is flat. Affect is not angry.        Speech: Speech is tangential. Speech is not rapid and pressured, delayed or slurred.        Behavior: Behavior normal. Behavior is not agitated, slowed, aggressive or hyperactive. Behavior is cooperative.        Thought Content: Thought content normal. Thought content is not paranoid or delusional. Thought content does  not include homicidal or suicidal ideation.        Cognition and Memory: Cognition normal.        Judgment: Judgment normal.    Lab Results  Component Value Date   WBC 6.6 12/24/2021   HGB 15.1 12/24/2021   HCT 45.6 12/24/2021   PLT 285 12/24/2021   GLUCOSE 85 12/24/2021   CHOL 155 12/24/2021   TRIG 58 12/24/2021   HDL 38 (L) 12/24/2021   LDLCALC 105 (H) 12/24/2021   ALT 25 12/24/2021   AST 18 12/24/2021   NA 140 12/24/2021   K 3.9 12/24/2021   CL 103 12/24/2021   CREATININE 0.96 12/24/2021   BUN 6 12/24/2021   CO2 26 12/24/2021   TSH 0.504 12/24/2021   HGBA1C 4.4 (L) 12/24/2021    No results found.  Assessment & Plan:   Jonathan White was seen today for hypertension.  Diagnoses and all orders for this visit:  High risk homosexual behavior -     emtricitabine-tenofovir AF (DESCOVY) 200-25 MG tablet; Take 1 tablet by mouth daily.  Primary hypertension- His BP is not well controlled. Will start indapamide and he will improve his LS modifications. -     indapamide (LOZOL) 1.25 MG tablet; Take 1 tablet (1.25 mg total) by mouth daily.  Adjustment disorder with depressed mood- He is improving. Will start an antidepressant if indicated.   I have changed Jonathan White's Descovy. I am also having him start on indapamide. Additionally, I am having him maintain his Calcium Polycarbophil (FIBER-CAPS PO).  Meds ordered this encounter  Medications   emtricitabine-tenofovir AF (DESCOVY) 200-25 MG tablet    Sig: Take 1 tablet by mouth daily.    Dispense:  90 tablet    Refill:  0   indapamide (LOZOL) 1.25 MG tablet    Sig: Take 1 tablet (1.25 mg total) by mouth daily.    Dispense:  90 tablet    Refill:  0     Follow-up: Return in about 3 months (around 04/14/2022).  Jonathan Calico, MD

## 2022-01-18 DIAGNOSIS — F4321 Adjustment disorder with depressed mood: Secondary | ICD-10-CM | POA: Insufficient documentation

## 2022-01-23 ENCOUNTER — Encounter: Payer: Self-pay | Admitting: Gastroenterology

## 2022-01-24 ENCOUNTER — Telehealth: Payer: Self-pay | Admitting: *Deleted

## 2022-01-24 DIAGNOSIS — K601 Chronic anal fissure: Secondary | ICD-10-CM

## 2022-01-24 MED ORDER — SUTAB 1479-225-188 MG PO TABS: 1.0000 | ORAL_TABLET | Freq: Once | ORAL | 0 refills | Status: AC

## 2022-01-24 NOTE — Telephone Encounter (Signed)
Patient called stating no prescription at pharmacy.  Sutab rx sent to patient's pharmacy, instructions sent through [facility] and reviewed with patient. ?

## 2022-01-27 ENCOUNTER — Ambulatory Visit (AMBULATORY_SURGERY_CENTER): Payer: BC Managed Care – PPO | Admitting: Gastroenterology

## 2022-01-27 ENCOUNTER — Encounter: Payer: Self-pay | Admitting: Gastroenterology

## 2022-01-27 VITALS — BP 135/92 | HR 72 | Temp 97.1°F | Resp 15 | Ht 72.0 in | Wt 238.0 lb

## 2022-01-27 DIAGNOSIS — D129 Benign neoplasm of anus and anal canal: Secondary | ICD-10-CM

## 2022-01-27 DIAGNOSIS — K601 Chronic anal fissure: Secondary | ICD-10-CM

## 2022-01-27 DIAGNOSIS — K920 Hematemesis: Secondary | ICD-10-CM

## 2022-01-27 DIAGNOSIS — K640 First degree hemorrhoids: Secondary | ICD-10-CM | POA: Diagnosis not present

## 2022-01-27 DIAGNOSIS — D128 Benign neoplasm of rectum: Secondary | ICD-10-CM

## 2022-01-27 DIAGNOSIS — K621 Rectal polyp: Secondary | ICD-10-CM

## 2022-01-27 MED ORDER — SODIUM CHLORIDE 0.9 % IV SOLN: 500.0000 mL | Freq: Once | INTRAVENOUS | Status: DC

## 2022-01-27 NOTE — Progress Notes (Signed)
Report to PACU, RN, vss, BBS= Clear.  

## 2022-01-27 NOTE — Progress Notes (Signed)
Called to room to assist during endoscopic procedure.  Patient ID and intended procedure confirmed with present staff. Received instructions for my participation in the procedure from the performing physician.  

## 2022-01-27 NOTE — Progress Notes (Signed)
Patient recently started on meds for HTN. ?

## 2022-01-27 NOTE — Patient Instructions (Addendum)
Handout on polyps and hemorrhoids  provided  ? ?Await pathology results.  ? ?Continue current medications.  ? ?Repeat colonoscopy at age 31 for screening purposes. ? ?Continue fiber supplementation to minimize hemorrhoidal bleeding. ? ? ?YOU HAD AN ENDOSCOPIC PROCEDURE TODAY AT [facility]:   Refer to the procedure report that was given to you for any specific questions about what was found during the examination.  If the procedure report does not answer your questions, please call your gastroenterologist to clarify.  If you requested that your care partner not be given the details of your procedure findings, then the procedure report has been included in a sealed envelope for you to review at your convenience later. ? ?YOU SHOULD EXPECT: Some feelings of bloating in the abdomen. Passage of more gas than usual.  Walking can help get rid of the air that was put into your GI tract during the procedure and reduce the bloating. If you had a lower endoscopy (such as a colonoscopy or flexible sigmoidoscopy) you may notice spotting of blood in your stool or on the toilet paper. If you underwent a bowel prep for your procedure, you may not have a normal bowel movement for a few days. ? ?Please Note:  You might notice some irritation and congestion in your nose or some drainage.  This is from the oxygen used during your procedure.  There is no need for concern and it should clear up in a day or so. ? ?SYMPTOMS TO REPORT IMMEDIATELY: ? ?Following lower endoscopy (colonoscopy or flexible sigmoidoscopy): ? Excessive amounts of blood in the stool ? Significant tenderness or worsening of abdominal pains ? Swelling of the abdomen that is new, acute ? Fever of 100?F or higher ? ?For urgent or emergent issues, a gastroenterologist can be reached at any hour by calling [phone]. ?Do not use MyChart messaging for urgent concerns.  ? ? ?DIET:  We do recommend a small meal at first, but then you may proceed to  your regular diet.  Drink plenty of fluids but you should avoid alcoholic beverages for 24 hours. ? ?ACTIVITY:  You should plan to take it easy for the rest of today and you should NOT DRIVE or use heavy machinery until tomorrow (because of the sedation medicines used during the test).   ? ?FOLLOW UP: ?Our staff will call the number listed on your records 48-72 hours following your procedure to check on you and address any questions or concerns that you may have regarding the information given to you following your procedure. If we do not reach you, we will leave a message.  We will attempt to reach you two times.  During this call, we will ask if you have developed any symptoms of COVID 19. If you develop any symptoms (ie: fever, flu-like symptoms, shortness of breath, cough etc.) before then, please call [phone].  If you test positive for Covid 19 in the 2 weeks post procedure, please call and report this information to [place].   ? ?If any biopsies were taken you will be contacted by phone or by letter within the next 1-3 weeks.  Please call us at [phone] if you have not heard about the biopsies in 3 weeks.  ? ? ?SIGNATURES/CONFIDENTIALITY: ?You and/or your care partner have signed paperwork which will be entered into your electronic medical record.  These signatures attest to the fact that that the information above on your After Visit Summary has been reviewed and is  understood.  Full responsibility of the confidentiality of this discharge information lies with you and/or your care-partner. ? ?

## 2022-01-27 NOTE — Op Note (Signed)
[place] ?Patient Name: Jonathan White ?Procedure Date: 01/27/2022 3:14 PM ?MRN: [id number] ?Endoscopist: Aldine Grainger E. Candis Schatz , MD ?Age: 31 ?Referring MD:  ?Date of Birth: Jun 17, 1991 ?Gender: Male ?Account #: [id number] ?Procedure:                Colonoscopy ?Indications:              Hematochezia ?Medicines:                Monitored Anesthesia Care ?Procedure:                Pre-Anesthesia Assessment: ?                          - Prior to the procedure, a History and Physical  ?                          was performed, and patient medications and  ?                          allergies were reviewed. The patient's tolerance of  ?                          previous anesthesia was also reviewed. The risks  ?                          and benefits of the procedure and the sedation  ?                          options and risks were discussed with the patient.  ?                          All questions were answered, and informed consent  ?                          was obtained. Prior Anticoagulants: The patient has  ?                          taken no previous anticoagulant or antiplatelet  ?                          agents. ASA Grade Assessment: II - A patient with  ?                          mild systemic disease. After reviewing the risks  ?                          and benefits, the patient was deemed in  ?                          satisfactory condition to undergo the procedure. ?                          After obtaining informed consent, the colonoscope  ?  was passed under direct vision. Throughout the  ?                          procedure, the patient's blood pressure, pulse, and  ?                          oxygen saturations were monitored continuously. The  ?                          CF [id number] #[id number] was introduced through the anus  ?                          and advanced to the the terminal ileum, with  ?                          identification of the appendiceal orifice  and IC  ?                          valve. The colonoscopy was performed without  ?                          difficulty. The patient tolerated the procedure  ?                          well. The quality of the bowel preparation was  ?                          good. The terminal ileum, ileocecal valve,  ?                          appendiceal orifice, and rectum were photographed.  ?                          The bowel preparation used was Sutab via split dose  ?                          instruction. ?Scope In: 3:57:59 PM ?Scope Out: 4:11:48 PM ?Scope Withdrawal Time: 0 hours 11 minutes 10 seconds  ?Total Procedure Duration: 0 hours 13 minutes 49 seconds  ?Findings:                 The perianal and digital rectal examinations were  ?                          normal. Pertinent negatives include normal  ?                          sphincter tone and no palpable rectal lesions. No  ?                          anal fissure was identified ?                          The colon (entire examined portion) appeared normal. ?  The terminal ileum appeared normal. ?                          A 2 mm polypoid lesion was found in the distal  ?                          rectum. The lesion was frond-like/villous. No  ?                          bleeding was present. The polyp was removed with a  ?                          cold biopsy forceps. Resection and retrieval were  ?                          complete. Estimated blood loss was minimal. ?                          Non-bleeding internal hemorrhoids were found during  ?                          retroflexion. The hemorrhoids were Grade I  ?                          (internal hemorrhoids that do not prolapse). ?                          No additional abnormalities were found on  ?                          retroflexion. ?Complications:            No immediate complications. ?Estimated Blood Loss:     Estimated blood loss was minimal. ?Impression:               - The  entire examined colon is normal. ?                          - The examined portion of the ileum was normal. ?                          - Benign polypoid lesion in the distal rectum.  ?                          Complete removal was accomplished. Suspect this is  ?                          a papilloma ?                          - Non-bleeding internal hemorrhoids. ?                          - No anal fissure identified. ?Recommendation:           - Patient has a contact number available for  ?  emergencies. The signs and symptoms of potential  ?                          delayed complications were discussed with the  ?                          patient. Return to normal activities tomorrow.  ?                          Written discharge instructions were provided to the  ?                          patient. ?                          - Resume previous diet. ?                          - Continue present medications. ?                          - Await pathology results. ?                          - Repeat colonoscopy at age 23 for screening  ?                          purposes. ?                          - Continue fiber supplementation to minimize  ?                          hemorrhoidal bleeding. ?Andru Genter E. Candis Schatz, MD ?01/27/2022 4:18:33 PM ?This report has been signed electronically. ?

## 2022-01-27 NOTE — Progress Notes (Signed)
[facility] History and Physical ? ? ?Primary Care Physician:  Janith Lima, MD ? ? ?Reason for Procedure:   Refractory anal fissure ? ?Plan:    Colonoscopy ? ? ? ? ?HPI: Jonathan White is a 31 y.o. male undergoing colonoscopy to exclude Crohn's disease as potential cause for refractory anal fissure and hematochezia.  He has had symptoms of rectal bleeding and dyschezia for many years which have not resolved with conservative therapy. ? ? ?Past Medical History:  ?Diagnosis Date  ? ADHD   ? Bipolar 1 disorder ([place])   ? Bipolar affective disorder ([place])   ? Depression   ? ? ?Past Surgical History:  ?Procedure Laterality Date  ? CARDIAC CATHETERIZATION Left 01/22/2018  ? NO PAST SURGERIES    ? ? ?Prior to Admission medications   ?Medication Sig Start Date End Date Taking? Authorizing Provider  ?Calcium Polycarbophil (FIBER-CAPS PO) Take 1 capsule by mouth in the morning and at bedtime.   Yes [provider]  ?emtricitabine-tenofovir AF (DESCOVY) 200-25 MG tablet Take 1 tablet by mouth daily. 01/15/22  Yes Janith Lima, MD  ?indapamide (LOZOL) 1.25 MG tablet Take 1 tablet (1.25 mg total) by mouth daily. 01/15/22  Yes Janith Lima, MD  ? ? ?Current Outpatient Medications  ?Medication Sig Dispense Refill  ? Calcium Polycarbophil (FIBER-CAPS PO) Take 1 capsule by mouth in the morning and at bedtime.    ? emtricitabine-tenofovir AF (DESCOVY) 200-25 MG tablet Take 1 tablet by mouth daily. 90 tablet 0  ? indapamide (LOZOL) 1.25 MG tablet Take 1 tablet (1.25 mg total) by mouth daily. 90 tablet 0  ? ?Current Facility-Administered Medications  ?Medication Dose Route Frequency Provider Last Rate Last Admin  ? 0.9 %  sodium chloride infusion  500 mL Intravenous Once Daryel November, MD      ? ? ?Allergies as of 01/27/2022  ? (No Known Allergies)  ? ? ?Family History  ?Problem Relation Age of Onset  ? Hypertension Father   ? Stroke Father   ? Alcohol abuse Sister   ? Mental illness Sister   ?  Schizophrenia Brother   ? Mental illness Brother   ? Colon cancer Neg Hx   ? Esophageal cancer Neg Hx   ? Pancreatic cancer Neg Hx   ? Stomach cancer Neg Hx   ? Liver disease Neg Hx   ? ? ?Social History  ? ?Socioeconomic History  ? Marital status: Married  ?  Spouse name: Not on file  ? Number of children: Not on file  ? Years of education: Not on file  ? Highest education level: Not on file  ?Occupational History  ? Occupation: Ship broker  ?  Employer: [employer]  ?Tobacco Use  ? Smoking status: Never  ? Smokeless tobacco: Never  ?Vaping Use  ? Vaping Use: Never used  ?Substance and Sexual Activity  ? Alcohol use: Yes  ?  Comment: 1x a month  ? Drug use: Yes  ?  Frequency: 7.0 times per week  ?  Types: Marijuana, PCP  ?  Comment: smoked marijuana this morning at 10:30am  ? Sexual activity: Yes  ?  Partners: Male  ?  Birth control/protection: Condom  ?Other Topics Concern  ? Not on file  ?Social History Narrative  ? ** Merged History Encounter **  ?    ? Lives with mother  ? Parents divorced   ? Current student at [employer]  ? Part time job at [employer]  ?   ?  1 brother ,  1 sister  ? Brother hops around, sister lives in [place]  ?   ? Father has htn  ? No cancer  ? ?Social Determinants of Health  ? ?Financial Resource Strain: Not on file  ?Food Insecurity: Not on file  ?Transportation Needs: Not on file  ?Physical Activity: Not on file  ?Stress: Not on file  ?Social Connections: Not on file  ?Intimate Partner Violence: Not on file  ? ? ?Review of Systems: ? ?All other review of systems negative except as mentioned in the HPI. ? ?Physical Exam: ?Vital signs ?BP (!) 156/66   Pulse 82   Temp (!) 97.1 ?F (36.2 ?C) (Temporal)   Ht 6' (1.829 m)   Wt 238 lb (108 kg)   SpO2 97%   BMI 32.28 kg/m?  ? ?General:   Alert,  Well-developed, well-nourished, pleasant and cooperative in NAD ?Airway:  Mallampati 3 ?Lungs:  Clear throughout to auscultation.   ?Heart:  Regular rate and rhythm; no murmurs, clicks, rubs,  or  gallops. ?Abdomen:  Soft, nontender and nondistended. Normal bowel sounds.   ?Neuro/Psych:  Normal mood and affect. A and O x 3 ? ? ?Aniya Jolicoeur E. Candis Schatz, MD ?[facility] ? ?

## 2022-01-29 ENCOUNTER — Telehealth: Payer: Self-pay | Admitting: *Deleted

## 2022-01-29 NOTE — Telephone Encounter (Signed)
?  Follow up Call- ? ?Call back number 01/27/2022  ?Post procedure Call Back phone  # [phone]  ?Permission to leave phone message Yes  ?Some recent data might be hidden  ?  ? ?Patient questions: ? ?Do you have a fever, pain , or abdominal swelling? No. ?Pain Score  0 * ? ?Have you tolerated food without any problems? Yes.   ? ?Have you been able to return to your normal activities? Yes.   ? ?Do you have any questions about your discharge instructions: ?Diet   No. ?Medications  No. ?Follow up visit  No. ? ?Do you have questions or concerns about your Care? No. ? ?Actions: ?* If pain score is 4 or above: ?No action needed, pain <4. ? ? ?

## 2022-01-30 NOTE — Progress Notes (Signed)
Jonathan White, ?Good news: the polyp that I removed during your recent examination was NOT precancerous, nor was it a papilloma (related to a virus).  No further evaluation or follow up of this polyp is recommended.  You should continue to follow current colorectal cancer screening guidelines with a repeat colonoscopy at age 31.   ? ?Continue the Metamucil daily to reduce hemorrhoidal bleeding.  Follow up with me in clinic as needed. ?

## 2022-04-14 ENCOUNTER — Ambulatory Visit: Payer: BC Managed Care – PPO | Admitting: Internal Medicine

## 2022-04-14 ENCOUNTER — Encounter: Payer: Self-pay | Admitting: Internal Medicine

## 2022-04-14 VITALS — BP 144/96 | HR 57 | Temp 98.5°F | Resp 16 | Ht 72.0 in | Wt 247.0 lb

## 2022-04-14 DIAGNOSIS — I10 Essential (primary) hypertension: Secondary | ICD-10-CM

## 2022-04-14 DIAGNOSIS — Z7252 High risk homosexual behavior: Secondary | ICD-10-CM

## 2022-04-14 DIAGNOSIS — F900 Attention-deficit hyperactivity disorder, predominantly inattentive type: Secondary | ICD-10-CM | POA: Diagnosis not present

## 2022-04-14 DIAGNOSIS — Z23 Encounter for immunization: Secondary | ICD-10-CM

## 2022-04-14 DIAGNOSIS — Z0001 Encounter for general adult medical examination with abnormal findings: Secondary | ICD-10-CM

## 2022-04-14 DIAGNOSIS — F4321 Adjustment disorder with depressed mood: Secondary | ICD-10-CM

## 2022-04-14 LAB — CBC WITH DIFFERENTIAL/PLATELET
Basophils Absolute: 0 10*3/uL (ref 0.0–0.1)
Basophils Relative: 0.3 % (ref 0.0–3.0)
Eosinophils Absolute: 0.2 10*3/uL (ref 0.0–0.7)
Eosinophils Relative: 3 % (ref 0.0–5.0)
HCT: 42.3 % (ref 39.0–52.0)
Hemoglobin: 14.1 g/dL (ref 13.0–17.0)
Lymphocytes Relative: 36.7 % (ref 12.0–46.0)
Lymphs Abs: 2.7 10*3/uL (ref 0.7–4.0)
MCHC: 33.3 g/dL (ref 30.0–36.0)
MCV: 94.8 fl (ref 78.0–100.0)
Monocytes Absolute: 0.7 10*3/uL (ref 0.1–1.0)
Monocytes Relative: 8.9 % (ref 3.0–12.0)
Neutro Abs: 3.8 10*3/uL (ref 1.4–7.7)
Neutrophils Relative %: 51.1 % (ref 43.0–77.0)
Platelets: 262 10*3/uL (ref 150.0–400.0)
RBC: 4.46 Mil/uL (ref 4.22–5.81)
RDW: 12.5 % (ref 11.5–15.5)
WBC: 7.4 10*3/uL (ref 4.0–10.5)

## 2022-04-14 LAB — BASIC METABOLIC PANEL
BUN: 12 mg/dL (ref 6–23)
CO2: 30 mEq/L (ref 19–32)
Calcium: 9.6 mg/dL (ref 8.4–10.5)
Chloride: 103 mEq/L (ref 96–112)
Creatinine, Ser: 1 mg/dL (ref 0.40–1.50)
GFR: 99.59 mL/min (ref >60.00)
Glucose, Bld: 98 mg/dL (ref 70–99)
Potassium: 4.2 mEq/L (ref 3.5–5.1)
Sodium: 137 mEq/L (ref 135–145)

## 2022-04-14 MED ORDER — ATOMOXETINE HCL 40 MG PO CAPS: 40.0000 mg | ORAL_CAPSULE | Freq: Every day | ORAL | 1 refills | Status: DC

## 2022-04-14 MED ORDER — INDAPAMIDE 1.25 MG PO TABS: 1.2500 mg | ORAL_TABLET | Freq: Every day | ORAL | 0 refills | Status: DC

## 2022-04-14 NOTE — Progress Notes (Unsigned)
Subjective:  Patient ID: Jonathan White, male    DOB: Jul 30, 1991  Age: 31 y.o. MRN: [id number]  CC: Hypertension and Annual Exam   HPI Jonathan White presents for a CPX and f/up -   He has not taken lozol in 3 weeks.  He wants to restart strattera. He wants to know if he can change PrEP to an injectable option.  Outpatient Medications Prior to Visit  Medication Sig Dispense Refill   Calcium Polycarbophil (FIBER-CAPS PO) Take 1 capsule by mouth in the morning and at bedtime.     emtricitabine-tenofovir AF (DESCOVY) 200-25 MG tablet Take 1 tablet by mouth daily. 90 tablet 0   indapamide (LOZOL) 1.25 MG tablet Take 1 tablet (1.25 mg total) by mouth daily. 90 tablet 0   No facility-administered medications prior to visit.    ROS Review of Systems  Constitutional: Negative.  Negative for chills, diaphoresis, fatigue and fever.  HENT: Negative.    Eyes: Negative.   Respiratory:  Negative for cough, shortness of breath and wheezing.   Cardiovascular:  Negative for chest pain, palpitations and leg swelling.  Gastrointestinal:  Negative for abdominal pain, constipation, diarrhea and vomiting.  Endocrine: Negative.   Genitourinary: Negative.  Negative for difficulty urinating, dysuria, genital sores, hematuria, testicular pain and urgency.  Musculoskeletal: Negative.   Skin: Negative.  Negative for rash.  Neurological:  Negative for dizziness, weakness, light-headedness and headaches.  Hematological:  Negative for adenopathy. Does not bruise/bleed easily.  Psychiatric/Behavioral: Negative.     Objective:  BP (!) 144/96 (BP Location: Left Arm, Patient Position: Sitting, Cuff Size: Large)   Pulse (!) 57   Temp 98.5 F (36.9 C) (Oral)   Resp 16   Ht 6' (1.829 m)   Wt 247 lb (112 kg)   SpO2 97%   BMI 33.50 kg/m   BP Readings from Last 3 Encounters:  04/14/22 (!) 144/96  01/27/22 (!) 135/92  01/15/22 (!) 148/98    Wt Readings from Last 3 Encounters:  04/14/22  247 lb (112 kg)  01/27/22 238 lb (108 kg)  01/15/22 238 lb (108 kg)    Physical Exam Vitals reviewed.  HENT:     Nose: Nose normal.     Mouth/Throat:     Pharynx: No oropharyngeal exudate.  Eyes:     General: No scleral icterus.    Conjunctiva/sclera: Conjunctivae normal.  Cardiovascular:     Rate and Rhythm: Normal rate and regular rhythm.     Heart sounds: No murmur heard. Pulmonary:     Effort: Pulmonary effort is normal.     Breath sounds: No stridor. No wheezing, rhonchi or rales.  Abdominal:     General: Abdomen is flat.     Palpations: There is no mass.     Tenderness: There is no abdominal tenderness. There is no guarding.     Hernia: No hernia is present.  Musculoskeletal:        General: Normal range of motion.     Cervical back: Neck supple.     Right lower leg: No edema.     Left lower leg: No edema.  Lymphadenopathy:     Cervical: No cervical adenopathy.  Skin:    General: Skin is warm and dry.  Neurological:     General: No focal deficit present.     Mental Status: He is alert. Mental status is at baseline.  Psychiatric:        Mood and Affect: Mood normal.  Behavior: Behavior normal.    Lab Results  Component Value Date   WBC 7.4 04/14/2022   HGB 14.1 04/14/2022   HCT 42.3 04/14/2022   PLT 262.0 04/14/2022   GLUCOSE 98 04/14/2022   CHOL 155 12/24/2021   TRIG 58 12/24/2021   HDL 38 (L) 12/24/2021   LDLCALC 105 (H) 12/24/2021   ALT 25 12/24/2021   AST 18 12/24/2021   NA 137 04/14/2022   K 4.2 04/14/2022   CL 103 04/14/2022   CREATININE 1.00 04/14/2022   BUN 12 04/14/2022   CO2 30 04/14/2022   TSH 0.504 12/24/2021   HGBA1C 4.4 (L) 12/24/2021    No results found.  Assessment & Plan:   Jonathan White was seen today for hypertension and annual exam.  Diagnoses and all orders for this visit:  Encounter for general adult medical examination with abnormal findings- Exam completed, labs reviewed, vaccines are up-to-date, no cancer  screenings indicated, patient education was given. -     HIV Antibody (routine testing w rflx); Future -     HIV Antibody (routine testing w rflx)  Primary hypertension- His BP is not adequately well controlled. Will restart indapamide. -     indapamide (LOZOL) 1.25 MG tablet; Take 1 tablet (1.25 mg total) by mouth daily. -     Basic metabolic panel; Future -     CBC with Differential/Platelet; Future -     CBC with Differential/Platelet -     Basic metabolic panel  High risk homosexual behavior- His labs are negative.  Will continue PrEP. -     HIV Antibody (routine testing w rflx); Future -     RPR; Future -     Hepatitis B surface antigen; Future -     Ambulatory referral to Infectious Disease -     Hepatitis B surface antigen -     RPR -     HIV Antibody (routine testing w rflx) -     emtricitabine-tenofovir AF (DESCOVY) 200-25 MG tablet; Take 1 tablet by mouth daily.  Adjustment disorder with depressed mood  ADHD (attention deficit hyperactivity disorder), inattentive type -     atomoxetine (STRATTERA) 40 MG capsule; Take 1 capsule (40 mg total) by mouth daily.  Need for vaccination -     Tdap vaccine greater than or equal to 7yo IM  Other orders -     Cancel: Tdap vaccine greater than or equal to 7yo IM   I am having Jonathan White start on atomoxetine. I am also having him maintain his Calcium Polycarbophil (FIBER-CAPS PO), indapamide, and Descovy.  Meds ordered this encounter  Medications   indapamide (LOZOL) 1.25 MG tablet    Sig: Take 1 tablet (1.25 mg total) by mouth daily.    Dispense:  90 tablet    Refill:  0   atomoxetine (STRATTERA) 40 MG capsule    Sig: Take 1 capsule (40 mg total) by mouth daily.    Dispense:  90 capsule    Refill:  1   emtricitabine-tenofovir AF (DESCOVY) 200-25 MG tablet    Sig: Take 1 tablet by mouth daily.    Dispense:  90 tablet    Refill:  0     Follow-up: Return in about 3 months (around 07/15/2022).  Scarlette Calico,  MD

## 2022-04-14 NOTE — Patient Instructions (Signed)
Hypertension, Adult High blood pressure (hypertension) is when the force of blood pumping through the arteries is too strong. The arteries are the blood vessels that carry blood from the heart throughout the body. Hypertension forces the heart to work harder to pump blood and may cause arteries to become narrow or stiff. Untreated or uncontrolled hypertension can lead to a heart attack, heart failure, a stroke, kidney disease, and other problems. A blood pressure reading consists of a higher number over a lower number. Ideally, your blood pressure should be below 120/80. The first ("top") number is called the systolic pressure. It is a measure of the pressure in your arteries as your heart beats. The second ("bottom") number is called the diastolic pressure. It is a measure of the pressure in your arteries as the heart relaxes. What are the causes? The exact cause of this condition is not known. There are some conditions that result in high blood pressure. What increases the risk? Certain factors may make you more likely to develop high blood pressure. Some of these risk factors are under your control, including: Smoking. Not getting enough exercise or physical activity. Being overweight. Having too much fat, sugar, calories, or salt (sodium) in your diet. Drinking too much alcohol. Other risk factors include: Having a personal history of heart disease, diabetes, high cholesterol, or kidney disease. Stress. Having a family history of high blood pressure and high cholesterol. Having obstructive sleep apnea. Age. The risk increases with age. What are the signs or symptoms? High blood pressure may not cause symptoms. Very high blood pressure (hypertensive crisis) may cause: Headache. Fast or irregular heartbeats (palpitations). Shortness of breath. Nosebleed. Nausea and vomiting. Vision changes. Severe chest pain, dizziness, and seizures. How is this diagnosed? This condition is diagnosed by  measuring your blood pressure while you are seated, with your arm resting on a flat surface, your legs uncrossed, and your feet flat on the floor. The cuff of the blood pressure monitor will be placed directly against the skin of your upper arm at the level of your heart. Blood pressure should be measured at least twice using the same arm. Certain conditions can cause a difference in blood pressure between your right and left arms. If you have a high blood pressure reading during one visit or you have normal blood pressure with other risk factors, you may be asked to: Return on a different day to have your blood pressure checked again. Monitor your blood pressure at home for 1 week or longer. If you are diagnosed with hypertension, you may have other blood or imaging tests to help your health care provider understand your overall risk for other conditions. How is this treated? This condition is treated by making healthy lifestyle changes, such as eating healthy foods, exercising more, and reducing your alcohol intake. You may be referred for counseling on a healthy diet and physical activity. Your health care provider may prescribe medicine if lifestyle changes are not enough to get your blood pressure under control and if: Your systolic blood pressure is above 130. Your diastolic blood pressure is above 80. Your personal target blood pressure may vary depending on your medical conditions, your age, and other factors. Follow these instructions at home: Eating and drinking  Eat a diet that is high in fiber and potassium, and low in sodium, added sugar, and fat. An example of this eating plan is called the DASH diet. DASH stands for Dietary Approaches to Stop Hypertension. To eat this way: Eat   plenty of fresh fruits and vegetables. Try to fill one half of your plate at each meal with fruits and vegetables. Eat whole grains, such as whole-wheat pasta, brown rice, or whole-grain bread. Fill about one  fourth of your plate with whole grains. Eat or drink low-fat dairy products, such as skim milk or low-fat yogurt. Avoid fatty cuts of meat, processed or cured meats, and poultry with skin. Fill about one fourth of your plate with lean proteins, such as fish, chicken without skin, beans, eggs, or tofu. Avoid pre-made and processed foods. These tend to be higher in sodium, added sugar, and fat. Reduce your daily sodium intake. Many people with hypertension should eat less than 1,500 mg of sodium a day. Do not drink alcohol if: Your health care provider tells you not to drink. You are pregnant, may be pregnant, or are planning to become pregnant. If you drink alcohol: Limit how much you have to: 0-1 drink a day for women. 0-2 drinks a day for men. Know how much alcohol is in your drink. In the [place] one drink equals one 12 oz bottle of beer (355 mL), one 5 oz glass of wine (148 mL), or one 1 oz glass of hard liquor (44 mL). Lifestyle  Work with your health care provider to maintain a healthy body weight or to lose weight. Ask what an ideal weight is for you. Get at least 30 minutes of exercise that causes your heart to beat faster (aerobic exercise) most days of the week. Activities may include walking, swimming, or biking. Include exercise to strengthen your muscles (resistance exercise), such as Pilates or lifting weights, as part of your weekly exercise routine. Try to do these types of exercises for 30 minutes at least 3 days a week. Do not use any products that contain nicotine or tobacco. These products include cigarettes, chewing tobacco, and vaping devices, such as e-cigarettes. If you need help quitting, ask your health care provider. Monitor your blood pressure at home as told by your health care provider. Keep all follow-up visits. This is important. Medicines Take over-the-counter and prescription medicines only as told by your health care provider. Follow directions carefully. Blood  pressure medicines must be taken as prescribed. Do not skip doses of blood pressure medicine. Doing this puts you at risk for problems and can make the medicine less effective. Ask your health care provider about side effects or reactions to medicines that you should watch for. Contact a health care provider if you: Think you are having a reaction to a medicine you are taking. Have headaches that keep coming back (recurring). Feel dizzy. Have swelling in your ankles. Have trouble with your vision. Get help right away if you: Develop a severe headache or confusion. Have unusual weakness or numbness. Feel faint. Have severe pain in your chest or abdomen. Vomit repeatedly. Have trouble breathing. These symptoms may be an emergency. Get help right away. Call 911. Do not wait to see if the symptoms will go away. Do not drive yourself to the hospital. Summary Hypertension is when the force of blood pumping through your arteries is too strong. If this condition is not controlled, it may put you at risk for serious complications. Your personal target blood pressure may vary depending on your medical conditions, your age, and other factors. For most people, a normal blood pressure is less than 120/80. Hypertension is treated with lifestyle changes, medicines, or a combination of both. Lifestyle changes include losing weight, eating a healthy,   low-sodium diet, exercising more, and limiting alcohol. This information is not intended to replace advice given to you by your health care provider. Make sure you discuss any questions you have with your health care provider. Document Revised: 09/17/2021 Document Reviewed: 09/17/2021 Elsevier Patient Education  2023 Elsevier Inc.  

## 2022-04-15 ENCOUNTER — Other Ambulatory Visit (HOSPITAL_COMMUNITY): Payer: Self-pay

## 2022-04-15 ENCOUNTER — Telehealth: Payer: Self-pay

## 2022-04-15 LAB — HEPATITIS B SURFACE ANTIGEN: Hepatitis B Surface Ag: NONREACTIVE

## 2022-04-15 LAB — RPR: RPR Ser Ql: NONREACTIVE

## 2022-04-15 LAB — HIV ANTIBODY (ROUTINE TESTING W REFLEX): HIV 1&2 Ab, 4th Generation: NONREACTIVE

## 2022-04-15 NOTE — Telephone Encounter (Addendum)
[facility] Patient Teacher, English as a foreign language completed.    The patient is insured through [employer] and has a $20.00 copay.  Patient should already have a copay card for Descovy to make his copay $0.00 Apretude will need a PA.  We will continue to follow to see if copay assistance is needed.  Ileene Patrick, [facility] Patient [facility] for Infectious Disease Phone: [phone] Fax:  [phone]

## 2022-04-16 ENCOUNTER — Ambulatory Visit (INDEPENDENT_AMBULATORY_CARE_PROVIDER_SITE_OTHER): Payer: BC Managed Care – PPO | Admitting: Pharmacist

## 2022-04-16 ENCOUNTER — Telehealth: Payer: Self-pay

## 2022-04-16 ENCOUNTER — Other Ambulatory Visit: Payer: Self-pay

## 2022-04-16 ENCOUNTER — Other Ambulatory Visit (HOSPITAL_COMMUNITY): Payer: Self-pay

## 2022-04-16 DIAGNOSIS — Z79899 Other long term (current) drug therapy: Secondary | ICD-10-CM

## 2022-04-16 MED ORDER — DESCOVY 200-25 MG PO TABS: 1.0000 | ORAL_TABLET | Freq: Every day | ORAL | 0 refills | Status: DC

## 2022-04-16 NOTE — Progress Notes (Signed)
Date:  04/16/2022   HPI: Jonathan White is a 31 y.o. male who presents to the [facility] for HIV PrEP follow-up.  Insured   '[x]'$    Uninsured  '[]'$    Patient Active Problem List   Diagnosis Date Noted   Adjustment disorder with depressed mood 01/18/2022   Bipolar affective disorder, depressed, severe ([place]) 12/24/2021   Hemorrhoids, internal, with bleeding 05/15/2021   Vitamin D deficiency disease 04/12/2021   Primary hypertension 04/11/2021   High risk homosexual behavior 04/11/2021   Encounter for general adult medical examination with abnormal findings 04/11/2021   ADHD (attention deficit hyperactivity disorder), inattentive type 04/11/2021   Priapism 02/19/2011   Bipolar disorder ([place]) 02/14/2011    Patient's Medications  New Prescriptions   No medications on file  Previous Medications   ATOMOXETINE (STRATTERA) 40 MG CAPSULE    Take 1 capsule (40 mg total) by mouth daily.   CALCIUM POLYCARBOPHIL (FIBER-CAPS PO)    Take 1 capsule by mouth in the morning and at bedtime.   EMTRICITABINE-TENOFOVIR AF (DESCOVY) 200-25 MG TABLET    Take 1 tablet by mouth daily.   INDAPAMIDE (LOZOL) 1.25 MG TABLET    Take 1 tablet (1.25 mg total) by mouth daily.  Modified Medications   No medications on file  Discontinued Medications   No medications on file    Allergies: No Known Allergies  Past Medical History: Past Medical History:  Diagnosis Date   ADHD    Bipolar 1 disorder ([place])    Bipolar affective disorder ([place])    Depression     Social History: Social History   Socioeconomic History   Marital status: Married    Spouse name: Not on file   Number of children: Not on file   Years of education: Not on file   Highest education level: Not on file  Occupational History   Occupation: Lexicographer: [employer]  Tobacco Use   Smoking status: Never    Passive exposure: Never   Smokeless tobacco: Never  Vaping Use   Vaping Use: Never used   Substance and Sexual Activity   Alcohol use: Yes    Comment: 1x a month   Drug use: Yes    Frequency: 7.0 times per week    Types: Marijuana, PCP    Comment: smoked marijuana this morning at 10:30am   Sexual activity: Yes    Partners: Male    Birth control/protection: Condom  Other Topics Concern   Not on file  Social History Narrative   ** Merged History Encounter **       Lives with mother   Parents divorced    Current student at [employer]   Part time job at [employer]      1 brother ,  1 sister   Brother hops around, sister lives in [place]      Father has htn   No cancer   Social Determinants of Health   Financial Resource Strain: Not on file  Food Insecurity: Not on file  Transportation Needs: Not on file  Physical Activity: Not on file  Stress: Not on file  Social Connections: Not on file        View : No data to display.          Labs:  SCr: Lab Results  Component Value Date   CREATININE 1.00 04/14/2022   CREATININE 0.96 12/24/2021   CREATININE 0.99 04/11/2021   CREATININE 1.00 02/21/2019   CREATININE 0.97  03/21/2016   HIV Lab Results  Component Value Date   HIV NON-REACTIVE 04/14/2022   HIV Non Reactive 12/24/2021   HIV NON-REACTIVE 04/11/2021   HIV NONREACTIVE 05/22/2016   HIV Non Reactive 03/21/2016   Hepatitis B Lab Results  Component Value Date   HEPBSAG NON-REACTIVE 04/14/2022   HEPBCAB NON-REACTIVE 04/11/2021   Hepatitis C Lab Results  Component Value Date   HEPCAB NON-REACTIVE 04/11/2021   Hepatitis A Lab Results  Component Value Date   HAV BORDERLINE (A) 04/11/2021   RPR and STI Lab Results  Component Value Date   LABRPR NON-REACTIVE 04/14/2022   LABRPR NON REACTIVE 12/24/2021   LABRPR NON-REACTIVE 04/11/2021   LABRPR NON REAC 05/22/2016   LABRPR Non Reactive 03/21/2016    STI Results GC GC CT CT  12/05/2020 12:00 AM Negative    Negative     06/22/2020 12:00 AM Negative    Negative     05/22/2016  2:30 PM  NOT DETECTED     NOT DETECTED    03/21/2016 12:00 AM Negative    Negative       Assessment: Jonathan White presented today to inquire about the apretude injection for PrEP. He has been on descovy, but it causes him nausea which has interrupted his daily life. His current partner is also on the injection which is why he was interested in addition to lessening his side effects. He expressed understanding with the lab scheduling, appointments, target date, and no show policy. Patient is agreeable and would like to proceed. Holding off on HIV RNA since he got his labs on 5/23. Will defer STI testing until he can come in for apretude. Plan for HPV vaccine series to line up with future apretude appointments.  Plan: - Continue descovy  - PA for apretude - Obtain STI testing (oral, rectal, and urine) on next visit - Align HPV vaccine with future apretude injections  Varney Daily, PharmD PGY1 Pharmacy Resident [facility] for Infectious Disease 04/16/2022, 11:03 AM

## 2022-04-16 NOTE — Telephone Encounter (Signed)
RCID Patient Advocate Encounter   Received notification from [facility] that prior authorization for Apretude is required.   PA submitted on 04/16/22 Key [id number] Status is pending    [facility] will continue to follow.   Ileene Patrick, [facility] Patient [facility] for Infectious Disease Phone: [phone] Fax:  [phone]

## 2022-04-22 ENCOUNTER — Other Ambulatory Visit (HOSPITAL_COMMUNITY): Payer: Self-pay

## 2022-05-06 ENCOUNTER — Other Ambulatory Visit (HOSPITAL_COMMUNITY): Payer: Self-pay

## 2022-05-06 ENCOUNTER — Telehealth: Payer: Self-pay

## 2022-05-06 NOTE — Telephone Encounter (Signed)
RCID Patient Advocate Encounter  Received notification from [facility] that the request for prior authorization for Apretude has been denied due to patient will need to have tried 3 formulary alternatives and failed. Medication is not covered by buy and bill they gave me a phone number for the [facility]  [phone] or [phone] to see if they have a fund for [place].   I sent an application to [employer] to see if they can do a benefit investigation.       This encounter will continue to be updated until final determination.    Ileene Patrick, [facility] Patient [facility] for Infectious Disease Phone: [phone] Fax:  [phone]

## 2022-06-02 ENCOUNTER — Telehealth: Payer: Self-pay

## 2022-06-02 ENCOUNTER — Other Ambulatory Visit (HOSPITAL_COMMUNITY): Payer: Self-pay

## 2022-06-02 NOTE — Telephone Encounter (Signed)
RCID Patient Advocate Encounter  Completed and sent [employer] application for Apretude for this patient who is uninsured.    Patient assistance phone number for follow up is [phone].   This encounter will be updated until final determination.   Ileene Patrick, [facility] Patient [facility] for Infectious Disease Phone: [phone] Fax:  [phone]

## 2022-06-16 ENCOUNTER — Other Ambulatory Visit (HOSPITAL_COMMUNITY): Payer: Self-pay

## 2022-07-31 ENCOUNTER — Other Ambulatory Visit: Payer: Self-pay | Admitting: Pharmacist

## 2022-07-31 DIAGNOSIS — Z79899 Other long term (current) drug therapy: Secondary | ICD-10-CM

## 2022-07-31 MED ORDER — APRETUDE 600 MG/3ML IM SUER: 600.0000 mg | INTRAMUSCULAR | 5 refills | Status: DC

## 2022-07-31 MED ORDER — APRETUDE 600 MG/3ML IM SUER: 600.0000 mg | INTRAMUSCULAR | 1 refills | Status: DC

## 2022-08-14 ENCOUNTER — Telehealth: Payer: Self-pay

## 2022-08-14 ENCOUNTER — Other Ambulatory Visit (HOSPITAL_COMMUNITY): Payer: Self-pay

## 2022-08-14 NOTE — Telephone Encounter (Addendum)
RCID Patient Advocate Encounter  Prior Authorization for Apretude has been approved.     Effective dates: 08/12/22 through 08/12/25  Prescription was filled [facility] 08/12/22 and will be shipped to the clinic.   Patient is enrolled in [facility] will continue to follow.  Ileene Patrick, [facility] Patient [facility] for Infectious Disease Phone: [phone] Fax:  [phone]

## 2022-10-03 ENCOUNTER — Telehealth: Payer: Self-pay | Admitting: Gastroenterology

## 2022-10-03 NOTE — Telephone Encounter (Signed)
Patient requested an appointment to follow up for health maintenance for vaccines. Called patient to discuss, he might need to follow up with PCP left voicemail.

## 2022-10-08 ENCOUNTER — Encounter: Payer: Self-pay | Admitting: Internal Medicine

## 2022-10-08 ENCOUNTER — Ambulatory Visit: Payer: BC Managed Care – PPO | Admitting: Internal Medicine

## 2022-10-08 VITALS — BP 134/90 | HR 60 | Temp 98.2°F | Resp 16 | Ht 72.0 in | Wt 254.1 lb

## 2022-10-08 DIAGNOSIS — R197 Diarrhea, unspecified: Secondary | ICD-10-CM

## 2022-10-08 DIAGNOSIS — Z23 Encounter for immunization: Secondary | ICD-10-CM | POA: Diagnosis not present

## 2022-10-08 DIAGNOSIS — Z7252 High risk homosexual behavior: Secondary | ICD-10-CM

## 2022-10-08 DIAGNOSIS — I10 Essential (primary) hypertension: Secondary | ICD-10-CM | POA: Diagnosis not present

## 2022-10-08 LAB — HEPATIC FUNCTION PANEL
ALT: 23 U/L (ref 0–53)
AST: 19 U/L (ref 0–37)
Albumin: 4 g/dL (ref 3.5–5.2)
Alkaline Phosphatase: 48 U/L (ref 39–117)
Bilirubin, Direct: 0.1 mg/dL (ref 0.0–0.3)
Total Bilirubin: 0.5 mg/dL (ref 0.2–1.2)
Total Protein: 7.3 g/dL (ref 6.0–8.3)

## 2022-10-08 LAB — CBC WITH DIFFERENTIAL/PLATELET
Basophils Absolute: 0 10*3/uL (ref 0.0–0.1)
Basophils Relative: 0.5 % (ref 0.0–3.0)
Eosinophils Absolute: 0.2 10*3/uL (ref 0.0–0.7)
Eosinophils Relative: 3.5 % (ref 0.0–5.0)
HCT: 43.4 % (ref 39.0–52.0)
Hemoglobin: 14.4 g/dL (ref 13.0–17.0)
Lymphocytes Relative: 39.3 % (ref 12.0–46.0)
Lymphs Abs: 2.3 10*3/uL (ref 0.7–4.0)
MCHC: 33.2 g/dL (ref 30.0–36.0)
MCV: 93.8 fl (ref 78.0–100.0)
Monocytes Absolute: 0.4 10*3/uL (ref 0.1–1.0)
Monocytes Relative: 7 % (ref 3.0–12.0)
Neutro Abs: 2.9 10*3/uL (ref 1.4–7.7)
Neutrophils Relative %: 49.7 % (ref 43.0–77.0)
Platelets: 246 10*3/uL (ref 150.0–400.0)
RBC: 4.63 Mil/uL (ref 4.22–5.81)
RDW: 12.9 % (ref 11.5–15.5)
WBC: 5.8 10*3/uL (ref 4.0–10.5)

## 2022-10-08 LAB — BASIC METABOLIC PANEL
BUN: 10 mg/dL (ref 6–23)
CO2: 29 mEq/L (ref 19–32)
Calcium: 9 mg/dL (ref 8.4–10.5)
Chloride: 107 mEq/L (ref 96–112)
Creatinine, Ser: 0.94 mg/dL (ref 0.40–1.50)
GFR: 106.9 mL/min (ref >60.00)
Glucose, Bld: 101 mg/dL — ABNORMAL HIGH (ref 70–99)
Potassium: 3.8 mEq/L (ref 3.5–5.1)
Sodium: 139 mEq/L (ref 135–145)

## 2022-10-08 NOTE — Patient Instructions (Signed)
Hypertension, Adult High blood pressure (hypertension) is when the force of blood pumping through the arteries is too strong. The arteries are the blood vessels that carry blood from the heart throughout the body. Hypertension forces the heart to work harder to pump blood and may cause arteries to become narrow or stiff. Untreated or uncontrolled hypertension can lead to a heart attack, heart failure, a stroke, kidney disease, and other problems. A blood pressure reading consists of a higher number over a lower number. Ideally, your blood pressure should be below 120/80. The first ("top") number is called the systolic pressure. It is a measure of the pressure in your arteries as your heart beats. The second ("bottom") number is called the diastolic pressure. It is a measure of the pressure in your arteries as the heart relaxes. What are the causes? The exact cause of this condition is not known. There are some conditions that result in high blood pressure. What increases the risk? Certain factors may make you more likely to develop high blood pressure. Some of these risk factors are under your control, including: Smoking. Not getting enough exercise or physical activity. Being overweight. Having too much fat, sugar, calories, or salt (sodium) in your diet. Drinking too much alcohol. Other risk factors include: Having a personal history of heart disease, diabetes, high cholesterol, or kidney disease. Stress. Having a family history of high blood pressure and high cholesterol. Having obstructive sleep apnea. Age. The risk increases with age. What are the signs or symptoms? High blood pressure may not cause symptoms. Very high blood pressure (hypertensive crisis) may cause: Headache. Fast or irregular heartbeats (palpitations). Shortness of breath. Nosebleed. Nausea and vomiting. Vision changes. Severe chest pain, dizziness, and seizures. How is this diagnosed? This condition is diagnosed by  measuring your blood pressure while you are seated, with your arm resting on a flat surface, your legs uncrossed, and your feet flat on the floor. The cuff of the blood pressure monitor will be placed directly against the skin of your upper arm at the level of your heart. Blood pressure should be measured at least twice using the same arm. Certain conditions can cause a difference in blood pressure between your right and left arms. If you have a high blood pressure reading during one visit or you have normal blood pressure with other risk factors, you may be asked to: Return on a different day to have your blood pressure checked again. Monitor your blood pressure at home for 1 week or longer. If you are diagnosed with hypertension, you may have other blood or imaging tests to help your health care provider understand your overall risk for other conditions. How is this treated? This condition is treated by making healthy lifestyle changes, such as eating healthy foods, exercising more, and reducing your alcohol intake. You may be referred for counseling on a healthy diet and physical activity. Your health care provider may prescribe medicine if lifestyle changes are not enough to get your blood pressure under control and if: Your systolic blood pressure is above 130. Your diastolic blood pressure is above 80. Your personal target blood pressure may vary depending on your medical conditions, your age, and other factors. Follow these instructions at home: Eating and drinking  Eat a diet that is high in fiber and potassium, and low in sodium, added sugar, and fat. An example of this eating plan is called the DASH diet. DASH stands for Dietary Approaches to Stop Hypertension. To eat this way: Eat   plenty of fresh fruits and vegetables. Try to fill one half of your plate at each meal with fruits and vegetables. Eat whole grains, such as whole-wheat pasta, brown rice, or whole-grain bread. Fill about one  fourth of your plate with whole grains. Eat or drink low-fat dairy products, such as skim milk or low-fat yogurt. Avoid fatty cuts of meat, processed or cured meats, and poultry with skin. Fill about one fourth of your plate with lean proteins, such as fish, chicken without skin, beans, eggs, or tofu. Avoid pre-made and processed foods. These tend to be higher in sodium, added sugar, and fat. Reduce your daily sodium intake. Many people with hypertension should eat less than 1,500 mg of sodium a day. Do not drink alcohol if: Your health care provider tells you not to drink. You are pregnant, may be pregnant, or are planning to become pregnant. If you drink alcohol: Limit how much you have to: 0-1 drink a day for women. 0-2 drinks a day for men. Know how much alcohol is in your drink. In the [place] one drink equals one 12 oz bottle of beer (355 mL), one 5 oz glass of wine (148 mL), or one 1 oz glass of hard liquor (44 mL). Lifestyle  Work with your health care provider to maintain a healthy body weight or to lose weight. Ask what an ideal weight is for you. Get at least 30 minutes of exercise that causes your heart to beat faster (aerobic exercise) most days of the week. Activities may include walking, swimming, or biking. Include exercise to strengthen your muscles (resistance exercise), such as Pilates or lifting weights, as part of your weekly exercise routine. Try to do these types of exercises for 30 minutes at least 3 days a week. Do not use any products that contain nicotine or tobacco. These products include cigarettes, chewing tobacco, and vaping devices, such as e-cigarettes. If you need help quitting, ask your health care provider. Monitor your blood pressure at home as told by your health care provider. Keep all follow-up visits. This is important. Medicines Take over-the-counter and prescription medicines only as told by your health care provider. Follow directions carefully. Blood  pressure medicines must be taken as prescribed. Do not skip doses of blood pressure medicine. Doing this puts you at risk for problems and can make the medicine less effective. Ask your health care provider about side effects or reactions to medicines that you should watch for. Contact a health care provider if you: Think you are having a reaction to a medicine you are taking. Have headaches that keep coming back (recurring). Feel dizzy. Have swelling in your ankles. Have trouble with your vision. Get help right away if you: Develop a severe headache or confusion. Have unusual weakness or numbness. Feel faint. Have severe pain in your chest or abdomen. Vomit repeatedly. Have trouble breathing. These symptoms may be an emergency. Get help right away. Call 911. Do not wait to see if the symptoms will go away. Do not drive yourself to the hospital. Summary Hypertension is when the force of blood pumping through your arteries is too strong. If this condition is not controlled, it may put you at risk for serious complications. Your personal target blood pressure may vary depending on your medical conditions, your age, and other factors. For most people, a normal blood pressure is less than 120/80. Hypertension is treated with lifestyle changes, medicines, or a combination of both. Lifestyle changes include losing weight, eating a healthy,   low-sodium diet, exercising more, and limiting alcohol. This information is not intended to replace advice given to you by your health care provider. Make sure you discuss any questions you have with your health care provider. Document Revised: 09/17/2021 Document Reviewed: 09/17/2021 Elsevier Patient Education  2023 Elsevier Inc.  

## 2022-10-08 NOTE — Progress Notes (Signed)
Subjective:  Patient ID: Jonathan White, male    DOB: January 26, 1991  Age: 31 y.o. MRN: [id number]  CC: Hypertension   HPI JAYDN MOSCATO presents for f/up -  He is not taking lozol. He complains of a several month hx of intermittent diarrhea possibly related to the occasion intake of beer. He wants to be tested for STI's.  Outpatient Medications Prior to Visit  Medication Sig Dispense Refill   cabotegravir ER (APRETUDE) 600 MG/3ML injection Inject 3 mLs (600 mg total) into the muscle every 2 (two) months. (Patient not taking: Reported on 10/08/2022) 3 mL 5   cabotegravir ER (APRETUDE) 600 MG/3ML injection Inject 3 mLs (600 mg total) into the muscle every 30 (thirty) days. (Patient not taking: Reported on 10/08/2022) 3 mL 1   Calcium Polycarbophil (FIBER-CAPS PO) Take 1 capsule by mouth in the morning and at bedtime. (Patient not taking: Reported on 10/08/2022)     emtricitabine-tenofovir AF (DESCOVY) 200-25 MG tablet Take 1 tablet by mouth daily. (Patient not taking: Reported on 10/08/2022) 90 tablet 0   indapamide (LOZOL) 1.25 MG tablet Take 1 tablet (1.25 mg total) by mouth daily. (Patient not taking: Reported on 10/08/2022) 90 tablet 0   atomoxetine (STRATTERA) 40 MG capsule Take 1 capsule (40 mg total) by mouth daily. (Patient not taking: Reported on 10/08/2022) 90 capsule 1   No facility-administered medications prior to visit.    ROS Review of Systems  Constitutional:  Positive for unexpected weight change (wt gain). Negative for appetite change, chills, diaphoresis, fatigue and fever.  HENT: Negative.  Negative for sore throat and trouble swallowing.   Eyes: Negative.   Respiratory: Negative.  Negative for cough, chest tightness, shortness of breath and wheezing.   Cardiovascular:  Negative for chest pain, palpitations and leg swelling.  Gastrointestinal:  Positive for diarrhea. Negative for abdominal pain, anal bleeding, blood in stool, constipation, nausea, rectal  pain and vomiting.  Endocrine: Negative.   Genitourinary: Negative.  Negative for difficulty urinating, dysuria, penile discharge, penile pain, penile swelling, scrotal swelling and testicular pain.  Musculoskeletal: Negative.  Negative for myalgias.  Skin: Negative.   Neurological: Negative.  Negative for dizziness and weakness.  Hematological:  Negative for adenopathy. Does not bruise/bleed easily.  Psychiatric/Behavioral: Negative.      Objective:  BP (!) 134/90 (BP Location: Left Arm, Patient Position: Standing, Cuff Size: Large)   Pulse 60   Temp 98.2 F (36.8 C) (Oral)   Resp 16   Ht 6' (1.829 m)   Wt 254 lb 2 oz (115.3 kg)   SpO2 94%   BMI 34.47 kg/m   BP Readings from Last 3 Encounters:  10/08/22 (!) 134/90  04/14/22 (!) 144/96  01/27/22 (!) 135/92    Wt Readings from Last 3 Encounters:  10/08/22 254 lb 2 oz (115.3 kg)  04/14/22 247 lb (112 kg)  01/27/22 238 lb (108 kg)    Physical Exam Vitals reviewed.  HENT:     Mouth/Throat:     Mouth: Mucous membranes are moist.  Eyes:     General: No scleral icterus. Cardiovascular:     Rate and Rhythm: Normal rate and regular rhythm.     Heart sounds: No murmur heard. Pulmonary:     Effort: Pulmonary effort is normal.     Breath sounds: No stridor. No wheezing, rhonchi or rales.  Abdominal:     General: Abdomen is flat.     Palpations: There is no mass.     Tenderness: There  is no abdominal tenderness. There is no guarding.     Hernia: No hernia is present.  Musculoskeletal:        General: Normal range of motion.     Cervical back: Neck supple.     Right lower leg: No edema.     Left lower leg: No edema.  Lymphadenopathy:     Cervical: No cervical adenopathy.  Skin:    General: Skin is warm and dry.     Findings: No lesion or rash.  Neurological:     General: No focal deficit present.     Mental Status: He is alert.  Psychiatric:        Mood and Affect: Mood normal.        Behavior: Behavior normal.      Lab Results  Component Value Date   WBC 5.8 10/08/2022   HGB 14.4 10/08/2022   HCT 43.4 10/08/2022   PLT 246.0 10/08/2022   GLUCOSE 101 (H) 10/08/2022   CHOL 155 12/24/2021   TRIG 58 12/24/2021   HDL 38 (L) 12/24/2021   LDLCALC 105 (H) 12/24/2021   ALT 23 10/08/2022   AST 19 10/08/2022   NA 139 10/08/2022   K 3.8 10/08/2022   CL 107 10/08/2022   CREATININE 0.94 10/08/2022   BUN 10 10/08/2022   CO2 29 10/08/2022   TSH 0.504 12/24/2021   HGBA1C 4.4 (L) 12/24/2021    No results found.  Assessment & Plan:   Amareon was seen today for hypertension.  Diagnoses and all orders for this visit:  Need for vaccination -     Flu Vaccine QUAD 6+ mos PF IM (Fluarix Quad PF)  Primary hypertension- His BP is not at goal due to non-compliance. -     Hepatic function panel; Future -     CBC with Differential/Platelet; Future -     Basic metabolic panel; Future -     Hepatic function panel -     CBC with Differential/Platelet -     Basic metabolic panel  High risk homosexual behavior- Labs are negative. He can continue taking PrEP. -     RPR; Future -     HIV Antibody (routine testing w rflx); Future -     Hepatitis B surface antigen; Future -     RPR -     HIV Antibody (routine testing w rflx) -     Hepatitis B surface antigen  Diarrhea, unspecified type- Celiac testing is negative. -     Tissue Transglutaminase Abs,IgG,IgA   I have discontinued Jasani T. Dickerman's atomoxetine. I am also having him maintain his Calcium Polycarbophil (FIBER-CAPS PO), indapamide, Descovy, Apretude, and Apretude.  No orders of the defined types were placed in this encounter.    Follow-up: Return in about 3 months (around 01/08/2023).  Scarlette Calico, MD

## 2022-10-09 LAB — RPR: RPR Ser Ql: NONREACTIVE

## 2022-10-09 LAB — TISSUE TRANSGLUTAMINASE ABS,IGG,IGA
(tTG) Ab, IgA: <1 U/mL
(tTG) Ab, IgG: 1.6 U/mL

## 2022-10-09 LAB — HEPATITIS B SURFACE ANTIGEN: Hepatitis B Surface Ag: NONREACTIVE

## 2022-10-09 LAB — HIV ANTIBODY (ROUTINE TESTING W REFLEX): HIV 1&2 Ab, 4th Generation: NONREACTIVE

## 2022-12-11 ENCOUNTER — Telehealth: Payer: Self-pay | Admitting: Pharmacist

## 2022-12-11 NOTE — Telephone Encounter (Signed)
I previously saw patient in May 2023 to discuss starting Apretude for PrEP. After a long 4 months of trying to get it approved, we were finally able to but patient never answered his phone or mychart messages about starting. He called today wanting to start.  I educated him about the importance of communication and again went over the commitment that Apretude requires - coming in on time to your appointments. He is still interested. [facility]. Scheduled him to see Estill Bamberg next Thursday 12/18/22 at 1030am.   Jnya Brossard L. Alnita Aybar, PharmD, BCIDP, AAHIVP, CPP Clinical Pharmacist Practitioner Infectious Diseases [place] [facility] 12/11/2022, 2:48 PM

## 2022-12-12 ENCOUNTER — Telehealth: Payer: Self-pay

## 2022-12-12 NOTE — Telephone Encounter (Signed)
[facility] Patient Advocate Encounter  Patient's medication (Apretude) have been couriered to [facility] from [facility] and will be administered on the patient next office visit on 12/18/22.  Ileene Patrick , [facility] Patient [facility] for Infectious Disease Phone: [phone] Fax:  [phone]

## 2022-12-18 ENCOUNTER — Ambulatory Visit: Payer: BC Managed Care – PPO | Admitting: Pharmacist

## 2022-12-23 ENCOUNTER — Other Ambulatory Visit: Payer: Self-pay

## 2022-12-23 ENCOUNTER — Ambulatory Visit (INDEPENDENT_AMBULATORY_CARE_PROVIDER_SITE_OTHER): Payer: BC Managed Care – PPO | Admitting: Pharmacist

## 2022-12-23 ENCOUNTER — Other Ambulatory Visit (HOSPITAL_COMMUNITY)
Admission: RE | Admit: 2022-12-23 | Discharge: 2022-12-23 | Disposition: A | Discharge status: Home / Self Care | Payer: BC Managed Care – PPO | Source: Ambulatory Visit | Attending: Infectious Disease | Admitting: Infectious Disease

## 2022-12-23 DIAGNOSIS — Z113 Encounter for screening for infections with a predominantly sexual mode of transmission: Secondary | ICD-10-CM | POA: Diagnosis not present

## 2022-12-23 DIAGNOSIS — Z2981 Encounter for HIV pre-exposure prophylaxis: Secondary | ICD-10-CM

## 2022-12-23 DIAGNOSIS — Z79899 Other long term (current) drug therapy: Secondary | ICD-10-CM

## 2022-12-23 MED ORDER — CABOTEGRAVIR ER 600 MG/3ML IM SUER
600.0000 mg | Freq: Once | INTRAMUSCULAR | Status: AC
  Administered 2022-12-23: 600 mg via INTRAMUSCULAR

## 2022-12-23 NOTE — Addendum Note (Signed)
Addended by: Esmond Plants on: 12/23/2022 11:02 AM   Modules accepted: Orders

## 2022-12-23 NOTE — Progress Notes (Signed)
NEW REFERRAL TO CPP CLINIC    HPI: Jonathan HOOS is a 32 y.o. male who presents to the [facility] for Apretude administration and HIV PrEP follow up.  Patient Active Problem List   Diagnosis Date Noted   Diarrhea 10/08/2022   Bipolar affective disorder, depressed, severe ([place]) 12/24/2021   Hemorrhoids, internal, with bleeding 05/15/2021   Vitamin D deficiency disease 04/12/2021   Primary hypertension 04/11/2021   High risk homosexual behavior 04/11/2021   Encounter for general adult medical examination with abnormal findings 04/11/2021   ADHD (attention deficit hyperactivity disorder), inattentive type 04/11/2021   Priapism 02/19/2011   Bipolar disorder ([place]) 02/14/2011    Patient's Medications  New Prescriptions   No medications on file  Previous Medications   CABOTEGRAVIR ER (APRETUDE) 600 MG/3ML INJECTION    Inject 3 mLs (600 mg total) into the muscle every 2 (two) months.   CABOTEGRAVIR ER (APRETUDE) 600 MG/3ML INJECTION    Inject 3 mLs (600 mg total) into the muscle every 30 (thirty) days.   CALCIUM POLYCARBOPHIL (FIBER-CAPS PO)    Take 1 capsule by mouth in the morning and at bedtime.   EMTRICITABINE-TENOFOVIR AF (DESCOVY) 200-25 MG TABLET    Take 1 tablet by mouth daily.   INDAPAMIDE (LOZOL) 1.25 MG TABLET    Take 1 tablet (1.25 mg total) by mouth daily.  Modified Medications   No medications on file  Discontinued Medications   No medications on file    Allergies: No Known Allergies  Past Medical History: Past Medical History:  Diagnosis Date   ADHD    Bipolar 1 disorder ([place])    Bipolar affective disorder ([place])    Depression     Social History: Social History   Socioeconomic History   Marital status: Married    Spouse name: Not on file   Number of children: Not on file   Years of education: Not on file   Highest education level: Not on file  Occupational History   Occupation: Ship broker    Employer: [employer]  Tobacco Use    Smoking status: Never    Passive exposure: Never   Smokeless tobacco: Never  Vaping Use   Vaping Use: Never used  Substance and Sexual Activity   Alcohol use: Yes    Alcohol/week: 2.0 standard drinks of alcohol    Types: 2 Cans of beer per week   Drug use: Yes    Frequency: 7.0 times per week    Types: Marijuana, PCP    Comment: smoked marijuana this morning at 10:30am   Sexual activity: Yes    Partners: Male    Birth control/protection: Condom  Other Topics Concern   Not on file  Social History Narrative   ** Merged History Encounter **       Lives with mother   Parents divorced    Current student at [employer]   Part time job at [employer]      1 brother ,  1 sister   Brother hops around, sister lives in [place]      Father has htn   No cancer   Social Determinants of Health   Financial Resource Strain: Not on file  Food Insecurity: Not on file  Transportation Needs: Not on file  Physical Activity: Not on file  Stress: Not on file  Social Connections: Not on file    Labs: No results found for: "HIV1RNAQUANT", "HIV1RNAVL", "CD4TABS"  RPR and STI Lab Results  Component Value Date  LABRPR NON-REACTIVE 10/08/2022   LABRPR NON-REACTIVE 04/14/2022   LABRPR NON REACTIVE 12/24/2021   LABRPR NON-REACTIVE 04/11/2021   LABRPR NON REAC 05/22/2016    STI Results GC GC CT CT  12/05/2020 12:00 AM Negative   Negative    06/22/2020 12:00 AM Negative   Negative    05/22/2016  2:30 PM  NOT DETECTED   NOT DETECTED   03/21/2016 12:00 AM Negative   Negative      Hepatitis B Lab Results  Component Value Date   HEPBSAG NON-REACTIVE 10/08/2022   HEPBCAB NON-REACTIVE 04/11/2021   Hepatitis C Lab Results  Component Value Date   HEPCAB NON-REACTIVE 04/11/2021   Hepatitis A Lab Results  Component Value Date   HAV BORDERLINE (A) 04/11/2021   Lipids: Lab Results  Component Value Date   CHOL 155 12/24/2021   TRIG 58 12/24/2021   HDL 38 (L) 12/24/2021   CHOLHDL 4.1  12/24/2021   VLDL 12 12/24/2021   LDLCALC 105 (H) 12/24/2021    TARGET DATE: The 30th of the month  Current PrEP Regimen: None   Assessment: Dencil presents today for their first initiation injection of Apretude and to follow up for HIV PrEP. He has previously taken Descovy and Truvada and has not been able to tolerate them d/t significant nausea and vomiting. Cassie and Butch Penny received approval for his Apretude last year and could not get in contact with him; he recently called the clinic expressing interest in finally starting the injections.   Counseled that Apretude is one intramuscular injection in the gluteal muscle for each visit. Explained that the second injection is 30 days after the initial injection then every 2 months thereafter. Discussed follow up appointments moving forward. It is required to have a negative HIV test immediately prior to injection administration. A rapid HIV blood test will be drawn each visit, and patient must wait for results before getting injection, which typically takes 20-30 minutes. Will make lab appointments for each injection 30 minutes prior to injection appointment. Screened patient for acute HIV symptoms such as fatigue, muscle aches, rash, sore throat, lymphadenopathy, headache, night sweats, nausea/vomiting/diarrhea, and fever. Patient denies any symptoms.   Explained that showing up to injection appointments is very important and warned that if appointments are missed, protection will be minimal and the risk of acquiring HIV becomes much higher. Counseled on possible side effects associated with the injections such as injection site pain, which is usually mild to moderate in nature, injection site nodules, and injection site reactions. Asked to call the clinic or send me a mychart message if they experience any issues. Advised that they can take Motrin or Tylenol for injection site pain if needed. They may also pre-treat with Motrin or Tylenol about  30-45 minutes before scheduled appointments.    Per [employer] guidelines, a rapid HIV test should be drawn prior to Apretude administration. Due to state shortage of rapid HIV tests, this is temporarily unable to be done. Per decision from [place], we will proceed with Apretude administration at this time without a negative rapid HIV test beforehand. HIV RNA was collected today and is in process.  Administered cabotegravir '600mg'$ /44m in right upper outer quadrant of the gluteal muscle. Monitored patient for 10 minutes after injection. Injections were tolerated well without issue. Will make follow up appointments for second initiation injection in 30 days and then maintenance injections every 2 months thereafter.   Patient states he was last tested for STIs two months ago and  has been with two new partners since then; will check RPR and urine/oral cytologies today as he is typically the insertive partner.   Plan: - First Apretude injection administered - Second initiation injection scheduled for 2/27 with me  - Maintenance injections scheduled for 4/23 and 6/25 with me  - Check HIV RNA, RPR, urine/oral cytologies - Call with any issues or questions  Alfonse Spruce, PharmD, CPP, BCIDP, [place] Clinical Pharmacist Practitioner Infectious Diseases [facility] for Infectious Disease

## 2022-12-25 LAB — HIV-1 RNA QUANT-NO REFLEX-BLD
HIV 1 RNA Quant: NOT DETECTED Copies/mL
HIV-1 RNA Quant, Log: NOT DETECTED Log cps/mL

## 2022-12-25 LAB — URINE CYTOLOGY ANCILLARY ONLY
Chlamydia: NEGATIVE
Comment: NEGATIVE
Comment: NORMAL
Neisseria Gonorrhea: NEGATIVE

## 2022-12-25 LAB — RPR: RPR Ser Ql: NONREACTIVE

## 2022-12-25 LAB — CYTOLOGY, (ORAL, ANAL, URETHRAL) ANCILLARY ONLY
Chlamydia: NEGATIVE
Comment: NEGATIVE
Comment: NORMAL
Neisseria Gonorrhea: NEGATIVE

## 2023-01-16 ENCOUNTER — Other Ambulatory Visit (HOSPITAL_COMMUNITY): Payer: Self-pay

## 2023-01-19 ENCOUNTER — Other Ambulatory Visit (HOSPITAL_COMMUNITY): Payer: Self-pay

## 2023-01-20 ENCOUNTER — Ambulatory Visit (INDEPENDENT_AMBULATORY_CARE_PROVIDER_SITE_OTHER): Payer: BC Managed Care – PPO | Admitting: Pharmacist

## 2023-01-20 ENCOUNTER — Other Ambulatory Visit (HOSPITAL_COMMUNITY)
Admission: RE | Admit: 2023-01-20 | Discharge: 2023-01-20 | Disposition: A | Discharge status: Home / Self Care | Payer: BC Managed Care – PPO | Source: Ambulatory Visit | Attending: Infectious Disease | Admitting: Infectious Disease

## 2023-01-20 ENCOUNTER — Telehealth: Payer: Self-pay | Admitting: Pharmacist

## 2023-01-20 ENCOUNTER — Other Ambulatory Visit: Payer: Self-pay

## 2023-01-20 ENCOUNTER — Ambulatory Visit (INDEPENDENT_AMBULATORY_CARE_PROVIDER_SITE_OTHER): Payer: BC Managed Care – PPO

## 2023-01-20 DIAGNOSIS — Z2981 Encounter for HIV pre-exposure prophylaxis: Secondary | ICD-10-CM

## 2023-01-20 DIAGNOSIS — Z113 Encounter for screening for infections with a predominantly sexual mode of transmission: Secondary | ICD-10-CM | POA: Diagnosis present

## 2023-01-20 DIAGNOSIS — Z23 Encounter for immunization: Secondary | ICD-10-CM | POA: Diagnosis not present

## 2023-01-20 DIAGNOSIS — Z79899 Other long term (current) drug therapy: Secondary | ICD-10-CM

## 2023-01-20 MED ORDER — CABOTEGRAVIR ER 600 MG/3ML IM SUER
600.0000 mg | Freq: Once | INTRAMUSCULAR | Status: AC
  Administered 2023-01-20: 600 mg via INTRAMUSCULAR

## 2023-01-20 NOTE — Telephone Encounter (Signed)
Patient's specialty medication ([facility]pretude) was [facility] from [facility] and will be administered at next office visit on 2/27.  Alfonse Spruce, PharmD, CPP, BCIDP, [place] Clinical Pharmacist Practitioner Infectious [place] [facility] Infect[facility] Disease

## 2023-01-20 NOTE — Progress Notes (Addendum)
HPI: Jonathan White is a 32 y.o. male who presents to the [facility] clinic for Apretude administration and HIV PrEP follow up.  Patient Active Problem List   Diagnosis Date Noted   Diarrhea 10/08/2022   Bipolar affective disorder, depressed, severe ([place]) 12/24/2021   Hemorrhoids, internal, with bleeding 05/15/2021   Vitamin D deficiency disease 04/12/2021   Primary hypertension 04/11/2021   High risk homosexual behavior 04/11/2021   Encounter for general adult medical examination with abnormal findings 04/11/2021   ADHD (attention deficit hyperactivity disorder), inattentive type 04/11/2021   Priapism 02/19/2011   Bipolar disorder ([place]) 02/14/2011    Patient's Medications  New Prescriptions   No medications on file  Previous Medications   CABOTEGRAVIR ER (APRETUDE) 600 MG/3ML INJECTION    Inject 3 mLs (600 mg total) into the muscle every 2 (two) months.   CABOTEGRAVIR ER (APRETUDE) 600 MG/3ML INJECTION    Inject 3 mLs (600 mg total) into the muscle every 30 (thirty) days.   CALCIUM POLYCARBOPHIL (FIBER-CAPS PO)    Take 1 capsule by mouth in the morning and at bedtime.   INDAPAMIDE (LOZOL) 1.25 MG TABLET    Take 1 tablet (1.25 mg total) by mouth daily.  Modified Medications   No medications on file  Discontinued Medications   No medications on file    Allergies: No Known Allergies  Past Medical History: Past Medical History:  Diagnosis Date   ADHD    Bipolar 1 disorder ([place])    Bipolar affective disorder ([place])    Depression     Social History: Social History   Socioeconomic History   Marital status: Married    Spouse name: Not on file   Number of children: Not on file   Years of education: Not on file   Highest education level: Not on file  Occupational History   Occupation: Ship broker    Employer: [employer]  Tobacco Use   Smoking status: Never    Passive exposure: Never   Smokeless tobacco: Never  Vaping Use   Vaping Use: Never used   Substance and Sexual Activity   Alcohol use: Yes    Alcohol/week: 2.0 standard drinks of alcohol    Types: 2 Cans of beer per week   Drug use: Yes    Frequency: 7.0 times per week    Types: Marijuana, PCP    Comment: smoked marijuana this morning at 10:30am   Sexual activity: Yes    Partners: Male    Birth control/protection: Condom  Other Topics Concern   Not on file  Social History Narrative   ** Merged History Encounter **       Lives with mother   Parents divorced    Current student at [employer]   Part time job at [employer]      1 brother ,  1 sister   Brother hops around, sister lives in [place]      Father has htn   No cancer   Social Determinants of Health   Financial Resource Strain: Not on file  Food Insecurity: Not on file  Transportation Needs: Not on file  Physical Activity: Not on file  Stress: Not on file  Social Connections: Not on file    Labs: Lab Results  Component Value Date   HIV1RNAQUANT Not Detected 12/23/2022    RPR and STI Lab Results  Component Value Date   LABRPR NON-REACTIVE 12/23/2022   LABRPR NON-REACTIVE 10/08/2022   LABRPR NON-REACTIVE 04/14/2022   LABRPR NON  REACTIVE 12/24/2021   LABRPR NON-REACTIVE 04/11/2021    STI Results GC GC CT CT  12/23/2022 11:02 AM Negative   Negative    12/23/2022 10:26 AM Negative   Negative    12/05/2020 12:00 AM Negative   Negative    06/22/2020 12:00 AM Negative   Negative    05/22/2016  2:30 PM  NOT DETECTED   NOT DETECTED   03/21/2016 12:00 AM Negative   Negative      Hepatitis B Lab Results  Component Value Date   HEPBSAG NON-REACTIVE 10/08/2022   HEPBCAB NON-REACTIVE 04/11/2021   Hepatitis C Lab Results  Component Value Date   HEPCAB NON-REACTIVE 04/11/2021   Hepatitis A Lab Results  Component Value Date   HAV BORDERLINE (A) 04/11/2021   Lipids: Lab Results  Component Value Date   CHOL 155 12/24/2021   TRIG 58 12/24/2021   HDL 38 (L) 12/24/2021   CHOLHDL 4.1 12/24/2021    VLDL 12 12/24/2021   LDLCALC 105 (H) 12/24/2021    TARGET DATE: 30th  Assessment: Jonathan White presents today for 2nd Apretude induction injection and to follow up for HIV PrEP. No issues with past injections. Screened for acute HIV symptoms such as fatigue, muscle aches, rash, sore throat, lymphadenopathy, headache, night sweats, nausea/vomiting/diarrhea, and fever. Denies any symptoms. No known exposures to any STIs since last visit. Agrees to full STI testing today with RPR and oral/urine/rectal cytologies.   He is eligible for COVID and HPV vaccines today. He got a single dose of mpox in 2022 but no further doses since then. He accepts COVID, HPV and mpox vaccines today.  Per [employer] guidelines, a rapid HIV test should be drawn prior to Apretude administration. Due to state shortage of rapid HIV tests, this is temporarily unable to be done. Per decision from [place], we will proceed with Apretude administration at this time without a negative rapid HIV test beforehand. HIV RNA was collected today and is in process.  Administered cabotegravir '600mg'$ /30m in right upper outer quadrant of the gluteal muscle. Will see him back in 2 months for injection, labs, and HIV PrEP follow up.  Plan: - Apretude injection administered - mpox, 1st HPV and COVID vaccine administered today - HIV RNA, RPR, oral/rectal/urinary cytologies today - Next injection, labs, and PrEP follow up appointment scheduled for 03/17/23 and 05/19/23 with AEstill Bamberg- Call with any issues or questions  KTitus Dubin PharmD PGY1 Pharmacy Resident 01/20/2023 2:20 PM

## 2023-01-21 LAB — CYTOLOGY, (ORAL, ANAL, URETHRAL) ANCILLARY ONLY
Chlamydia: NEGATIVE
Chlamydia: NEGATIVE
Comment: NEGATIVE
Comment: NEGATIVE
Comment: NORMAL
Comment: NORMAL
Neisseria Gonorrhea: NEGATIVE
Neisseria Gonorrhea: NEGATIVE

## 2023-01-21 LAB — URINE CYTOLOGY ANCILLARY ONLY
Chlamydia: NEGATIVE
Comment: NEGATIVE
Comment: NORMAL
Neisseria Gonorrhea: NEGATIVE

## 2023-01-23 LAB — HIV-1 RNA QUANT-NO REFLEX-BLD
HIV 1 RNA Quant: NOT DETECTED Copies/mL
HIV-1 RNA Quant, Log: NOT DETECTED Log cps/mL

## 2023-01-23 LAB — RPR: RPR Ser Ql: NONREACTIVE

## 2023-03-17 ENCOUNTER — Telehealth: Payer: Self-pay

## 2023-03-17 ENCOUNTER — Ambulatory Visit: Payer: BC Managed Care – PPO | Admitting: Pharmacist

## 2023-03-17 NOTE — Telephone Encounter (Addendum)
[facility] Patient Advocate Encounter  Patient's medication (Apretude) have been [facility]riered to [facility] and will be administered on the patient next office visit on 03/24/23.  Clearance Coots , CPhT Specialty Pharmacy Patient [facility] for Infectious Disease Phone: [phone] Fax:  [phone]

## 2023-03-24 ENCOUNTER — Ambulatory Visit: Payer: BC Managed Care – PPO | Admitting: Pharmacist

## 2023-03-31 ENCOUNTER — Ambulatory Visit (INDEPENDENT_AMBULATORY_CARE_PROVIDER_SITE_OTHER): Payer: BC Managed Care – PPO | Admitting: Pharmacist

## 2023-03-31 ENCOUNTER — Other Ambulatory Visit: Payer: Self-pay

## 2023-03-31 ENCOUNTER — Other Ambulatory Visit (HOSPITAL_COMMUNITY)
Admission: RE | Admit: 2023-03-31 | Discharge: 2023-03-31 | Disposition: A | Discharge status: Home / Self Care | Payer: BC Managed Care – PPO | Source: Ambulatory Visit | Attending: Infectious Disease | Admitting: Infectious Disease

## 2023-03-31 DIAGNOSIS — Z113 Encounter for screening for infections with a predominantly sexual mode of transmission: Secondary | ICD-10-CM | POA: Diagnosis not present

## 2023-03-31 DIAGNOSIS — Z2981 Encounter for HIV pre-exposure prophylaxis: Secondary | ICD-10-CM

## 2023-03-31 DIAGNOSIS — Z23 Encounter for immunization: Secondary | ICD-10-CM

## 2023-03-31 DIAGNOSIS — Z79899 Other long term (current) drug therapy: Secondary | ICD-10-CM

## 2023-03-31 MED ORDER — CABOTEGRAVIR ER 600 MG/3ML IM SUER
600.0000 mg | Freq: Once | INTRAMUSCULAR | Status: AC
  Administered 2023-03-31: 600 mg via INTRAMUSCULAR

## 2023-03-31 NOTE — Progress Notes (Signed)
HPI: Jonathan White is a 32 y.o. male who presents to the [facility] pharmacy clinic for Apretude administration and HIV PrEP follow up.  Patient Active Problem List   Diagnosis Date Noted   Diarrhea 10/08/2022   Bipolar affective disorder, depressed, severe (HCC) 12/24/2021   Hemorrhoids, internal, with bleeding 05/15/2021   Vitamin D deficiency disease 04/12/2021   Primary hypertension 04/11/2021   High risk homosexual behavior 04/11/2021   Encounter for general adult medical examination with abnormal findings 04/11/2021   ADHD (attention deficit hyperactivity disorder), inattentive type 04/11/2021   Priapism 02/19/2011   Bipolar disorder (HCC) 02/14/2011    Patient's Medications  New Prescriptions   No medications on file  Previous Medications   CABOTEGRAVIR ER (APRETUDE) 600 MG/3ML INJECTION    Inject 3 mLs (600 mg total) into the muscle every 2 (two) months.   CABOTEGRAVIR ER (APRETUDE) 600 MG/3ML INJECTION    Inject 3 mLs (600 mg total) into the muscle every 30 (thirty) days.   CALCIUM POLYCARBOPHIL (FIBER-CAPS PO)    Take 1 capsule by mouth in the morning and at bedtime.   INDAPAMIDE (LOZOL) 1.25 MG TABLET    Take 1 tablet (1.25 mg total) by mouth daily.  Modified Medications   No medications on file  Discontinued Medications   No medications on file    Allergies: No Known Allergies  Past Medical History: Past Medical History:  Diagnosis Date   ADHD    Bipolar 1 disorder (HCC)    Bipolar affective disorder (HCC)    Depression     Social History: Social History   Socioeconomic History   Marital status: Married    Spouse name: Not on file   Number of children: Not on file   Years of education: Not on file   Highest education level: Not on file  Occupational History   Occupation: Consulting civil engineer    Employer: [employer]  Tobacco Use   Smoking status: Never    Passive exposure: Never   Smokeless tobacco: Never  Vaping Use   Vaping Use: Never used   Substance and Sexual Activity   Alcohol use: Yes    Alcohol/week: 2.0 standard drinks of alcohol    Types: 2 Cans of beer per week   Drug use: Yes    Frequency: 7.0 times per week    Types: Marijuana, PCP    Comment: smoked marijuana this morning at 10:30am   Sexual activity: Yes    Partners: Male    Birth control/protection: Condom  Other Topics Concern   Not on file  Social History Narrative   ** Merged History Encounter **       Lives with mother   Parents divorced    Current student at [employer]   Part time job at [employer]      1 brother ,  1 sister   Brother hops around, sister lives in [place]      Father has htn   No cancer   Social Determinants of Health   Financial Resource Strain: Not on file  Food Insecurity: Not on file  Transportation Needs: Not on file  Physical Activity: Not on file  Stress: Not on file  Social Connections: Not on file    Labs: Lab Results  Component Value Date   HIV1RNAQUANT Not Detected 01/20/2023   HIV1RNAQUANT Not Detected 12/23/2022    RPR and STI Lab Results  Component Value Date   LABRPR NON-REACTIVE 01/20/2023   LABRPR NON-REACTIVE 12/23/2022   LABRPR  NON-REACTIVE 10/08/2022   LABRPR NON-REACTIVE 04/14/2022   LABRPR NON REACTIVE 12/24/2021    STI Results GC GC CT CT  01/20/2023  2:35 PM Negative    Negative    Negative   Negative    Negative    Negative    12/23/2022 11:02 AM Negative   Negative    12/23/2022 10:26 AM Negative   Negative    12/05/2020 12:00 AM Negative   Negative    06/22/2020 12:00 AM Negative   Negative    05/22/2016  2:30 PM  NOT DETECTED   NOT DETECTED   03/21/2016 12:00 AM Negative   Negative      Hepatitis B Lab Results  Component Value Date   HEPBSAG NON-REACTIVE 10/08/2022   HEPBCAB NON-REACTIVE 04/11/2021   Hepatitis C Lab Results  Component Value Date   HEPCAB NON-REACTIVE 04/11/2021   Hepatitis A Lab Results  Component Value Date   HAV BORDERLINE (A) 04/11/2021    Lipids: Lab Results  Component Value Date   CHOL 155 12/24/2021   TRIG 58 12/24/2021   HDL 38 (L) 12/24/2021   CHOLHDL 4.1 12/24/2021   VLDL 12 12/24/2021   LDLCALC 105 (H) 12/24/2021    TARGET DATE: 30th  Assessment: Jonathan White presents today for Apretude injection for HIV PrEP. He reports no issues with his previous injection and was screened for acute HIV symptoms such as fatigue, muscle aches, rash, sore throat, lymphadenopathy, headache, night sweats, nausea/vomiting/diarrhea, and fever. He denies any symptoms. Patient understands the importance of sticking with his target date given today was last day in his window. He agrees to full STI testing today with RPR and oral/urine cytologies. He received second dose of HPV series with last dose in 12/2022.  Per [employer] guidelines, a rapid HIV test should be drawn prior to Apretude administration. Due to state shortage of rapid HIV tests, this is temporarily unable to be done. Per decision from [facility] physicians, we will proceed with Apretude administration at this time without a negative rapid HIV test beforehand. HIV RNA was collected today and is in process.  Administered cabotegravir 600mg /50mL in right upper outer quadrant of the gluteal muscle. Will see him back in 2 months for injection, labs, and HIV PrEP follow up.  Plan: - Apretude injection administered - 2nd HPV administered today - F/u HIV RNA, RPR, oral/urinary cytologies today - Next injection, labs, and PrEP follow up appointment scheduled for 05/19/23 with Marchelle Folks - Call with any issues or questions

## 2023-04-02 LAB — URINE CYTOLOGY ANCILLARY ONLY
Chlamydia: NEGATIVE
Comment: NEGATIVE
Comment: NORMAL
Neisseria Gonorrhea: NEGATIVE

## 2023-04-02 LAB — CYTOLOGY, (ORAL, ANAL, URETHRAL) ANCILLARY ONLY
Chlamydia: NEGATIVE
Comment: NEGATIVE
Comment: NORMAL
Neisseria Gonorrhea: NEGATIVE

## 2023-04-03 LAB — RPR: RPR Ser Ql: NONREACTIVE

## 2023-04-03 LAB — HIV-1 RNA QUANT-NO REFLEX-BLD
HIV 1 RNA Quant: NOT DETECTED Copies/mL
HIV-1 RNA Quant, Log: NOT DETECTED Log cps/mL

## 2023-05-14 ENCOUNTER — Telehealth: Payer: Self-pay

## 2023-05-14 NOTE — Telephone Encounter (Signed)
[facility] Patient Advocate Encounter  Patient's medication (Apretude) have been couriered to [facility] from [facility] and will be administered on the patient next office visit on 05/19/23.  Clearance Coots , CPhT Specialty Pharmacy Patient [facility] Phone: [phone] Fax:  [phone]

## 2023-05-19 ENCOUNTER — Ambulatory Visit: Payer: BC Managed Care – PPO | Admitting: Pharmacist

## 2023-05-21 ENCOUNTER — Encounter: Payer: Self-pay | Admitting: Pharmacist

## 2023-06-04 ENCOUNTER — Telehealth: Payer: Self-pay | Admitting: Pharmacist

## 2023-06-04 NOTE — Telephone Encounter (Signed)
LVM for missed Apetude injection.

## 2023-06-12 ENCOUNTER — Ambulatory Visit: Payer: BC Managed Care – PPO | Admitting: Pharmacist

## 2023-06-15 ENCOUNTER — Ambulatory Visit: Payer: BC Managed Care – PPO | Admitting: Pharmacist

## 2023-06-16 ENCOUNTER — Other Ambulatory Visit: Payer: Self-pay

## 2023-06-16 ENCOUNTER — Ambulatory Visit (INDEPENDENT_AMBULATORY_CARE_PROVIDER_SITE_OTHER): Payer: BC Managed Care – PPO | Admitting: Pharmacist

## 2023-06-16 ENCOUNTER — Other Ambulatory Visit (HOSPITAL_COMMUNITY)
Admission: RE | Admit: 2023-06-16 | Discharge: 2023-06-16 | Disposition: A | Discharge status: Home / Self Care | Payer: BC Managed Care – PPO | Source: Ambulatory Visit | Attending: Infectious Disease | Admitting: Infectious Disease

## 2023-06-16 DIAGNOSIS — Z79899 Other long term (current) drug therapy: Secondary | ICD-10-CM

## 2023-06-16 DIAGNOSIS — Z113 Encounter for screening for infections with a predominantly sexual mode of transmission: Secondary | ICD-10-CM

## 2023-06-16 DIAGNOSIS — Z2981 Encounter for HIV pre-exposure prophylaxis: Secondary | ICD-10-CM

## 2023-06-16 MED ORDER — CABOTEGRAVIR ER 600 MG/3ML IM SUER
600.0000 mg | Freq: Once | INTRAMUSCULAR | Status: AC
Start: 2023-06-16 — End: 2023-06-16
  Administered 2023-06-16: 600 mg via INTRAMUSCULAR

## 2023-06-16 NOTE — Progress Notes (Signed)
HPI: Jonathan White is a 32 y.o. male who presents to the [facility] pharmacy clinic for Apretude administration and HIV PrEP follow up.  Patient Active Problem List   Diagnosis Date Noted   Diarrhea 10/08/2022   Bipolar affective disorder, depressed, severe (HCC) 12/24/2021   Hemorrhoids, internal, with bleeding 05/15/2021   Vitamin D deficiency disease 04/12/2021   Primary hypertension 04/11/2021   High risk homosexual behavior 04/11/2021   Encounter for general adult medical examination with abnormal findings 04/11/2021   ADHD (attention deficit hyperactivity disorder), inattentive type 04/11/2021   Priapism 02/19/2011   Bipolar disorder (HCC) 02/14/2011    Patient's Medications  New Prescriptions   No medications on file  Previous Medications   CABOTEGRAVIR ER (APRETUDE) 600 MG/3ML INJECTION    Inject 3 mLs (600 mg total) into the muscle every 2 (two) months.   CALCIUM POLYCARBOPHIL (FIBER-CAPS PO)    Take 1 capsule by mouth in the morning and at bedtime.   INDAPAMIDE (LOZOL) 1.25 MG TABLET    Take 1 tablet (1.25 mg total) by mouth daily.  Modified Medications   No medications on file  Discontinued Medications   CABOTEGRAVIR ER (APRETUDE) 600 MG/3ML INJECTION    Inject 3 mLs (600 mg total) into the muscle every 30 (thirty) days.    Allergies: No Known Allergies  Past Medical History: Past Medical History:  Diagnosis Date   ADHD    Bipolar 1 disorder (HCC)    Bipolar affective disorder (HCC)    Depression     Social History: Social History   Socioeconomic History   Marital status: Married    Spouse name: Not on file   Number of children: Not on file   Years of education: Not on file   Highest education level: Not on file  Occupational History   Occupation: Consulting civil engineer    Employer: [employer]  Tobacco Use   Smoking status: Never    Passive exposure: Never   Smokeless tobacco: Never  Vaping Use   Vaping status: Never Used  Substance and Sexual  Activity   Alcohol use: Yes    Alcohol/week: 2.0 standard drinks of alcohol    Types: 2 Cans of beer per week   Drug use: Yes    Frequency: 7.0 times per week    Types: Marijuana, PCP    Comment: smoked marijuana this morning at 10:30am   Sexual activity: Yes    Partners: Male    Birth control/protection: Condom  Other Topics Concern   Not on file  Social History Narrative   ** Merged History Encounter **       Lives with mother   Parents divorced    Current student at [employer]   Part time job at [employer]      1 brother ,  1 sister   Brother hops around, sister lives in [place]      Father has htn   No cancer   Social Determinants of Health   Financial Resource Strain: Not on file  Food Insecurity: Not on file  Transportation Needs: Not on file  Physical Activity: Not on file  Stress: Not on file  Social Connections: Unknown (03/28/2022)   Received from [facility]   Social Network    Social Network: Not on file    Labs: Lab Results  Component Value Date   HIV1RNAQUANT Not Detected 03/31/2023   HIV1RNAQUANT Not Detected 01/20/2023   HIV1RNAQUANT Not Detected 12/23/2022    RPR and STI Lab Results  Component Value Date   LABRPR NON-REACTIVE 03/31/2023   LABRPR NON-REACTIVE 01/20/2023   LABRPR NON-REACTIVE 12/23/2022   LABRPR NON-REACTIVE 10/08/2022   LABRPR NON-REACTIVE 04/14/2022    STI Results GC GC CT CT  03/31/2023  3:37 PM Negative    Negative   Negative    Negative    01/20/2023  2:35 PM Negative    Negative    Negative   Negative    Negative    Negative    12/23/2022 11:02 AM Negative   Negative    12/23/2022 10:26 AM Negative   Negative    12/05/2020 12:00 AM Negative   Negative    06/22/2020 12:00 AM Negative   Negative    05/22/2016  2:30 PM  NOT DETECTED   NOT DETECTED   03/21/2016 12:00 AM Negative   Negative      Hepatitis B Lab Results  Component Value Date   HEPBSAG NON-REACTIVE 10/08/2022   HEPBCAB NON-REACTIVE 04/11/2021    Hepatitis C Lab Results  Component Value Date   HEPCAB NON-REACTIVE 04/11/2021   Hepatitis A Lab Results  Component Value Date   HAV BORDERLINE (A) 04/11/2021   Lipids: Lab Results  Component Value Date   CHOL 155 12/24/2021   TRIG 58 12/24/2021   HDL 38 (L) 12/24/2021   CHOLHDL 4.1 12/24/2021   VLDL 12 12/24/2021   LDLCALC 105 (H) 12/24/2021    TARGET DATE: The 30th of the month  Current PrEP Regimen: Apretude  Assessment: Jonathan White presents today for their Apretude injection and to follow up for HIV PrEP. No issues with past injections. He has rescheduled several appointments and has been out of target window for over 2 weeks. He apologized for missing his appointments and states he has been working on organizing his schedule better and setting reminders for his appointments. Reviewed different strategies for this which he appreciated. Since he is outside of his target window but still < 30 days from his target window, will administer his injection as normal today and continue following up with him around his target date in August.   No known exposures to any STIs and no signs or symptoms of any STIs today. Last STI screening was in May and was negative. Screened patient for acute HIV symptoms such as fatigue, muscle aches, rash, sore throat, lymphadenopathy, headache, night sweats, nausea/vomiting/diarrhea, and fever. Patient denies any symptoms. Has been with new partners since last injection; will check RPR and urine/oral cytologies today.   Per [employer] guidelines, a rapid HIV test should be drawn prior to Apretude administration. Due to state shortage of rapid HIV tests, this is temporarily unable to be done. Per decision from [facility] physicians, we will proceed with Apretude administration at this time without a negative rapid HIV test beforehand. HIV RNA was collected today and is in process.  Administered cabotegravir 600mg /73mL in left upper outer quadrant of the  gluteal muscle. Will make follow up appointments for maintenance injections every 2 months.   Plan: - Maintenance injections scheduled for 8/27, 10/29, 12/31 with me  - Check HIV RNA, RPR, and urine/oral/rectal cytologies  - Call with any issues or questions  Margarite Gouge, PharmD, CPP, BCIDP, AAHIVP Clinical Pharmacist Practitioner Infectious Diseases Clinical Pharmacist [facility]

## 2023-06-17 LAB — CYTOLOGY, (ORAL, ANAL, URETHRAL) ANCILLARY ONLY
Chlamydia: NEGATIVE
Comment: NEGATIVE
Comment: NORMAL
Neisseria Gonorrhea: NEGATIVE

## 2023-06-17 LAB — URINE CYTOLOGY ANCILLARY ONLY
Chlamydia: NEGATIVE
Comment: NEGATIVE
Comment: NORMAL
Neisseria Gonorrhea: NEGATIVE

## 2023-06-18 LAB — HIV-1 RNA QUANT-NO REFLEX-BLD
HIV 1 RNA Quant: NOT DETECTED Copies/mL
HIV-1 RNA Quant, Log: NOT DETECTED Log cps/mL

## 2023-06-18 LAB — RPR: RPR Ser Ql: NONREACTIVE

## 2023-07-14 ENCOUNTER — Telehealth: Payer: Self-pay

## 2023-07-14 NOTE — Telephone Encounter (Signed)
[facility] Patient Advocate Encounter  Patient's medication (Apretude) have been couriered to [facility] from [facility] and will be administered on the patient next office visit on 07/21/23.  Clearance Coots , CPhT Specialty Pharmacy Patient [facility] Phone: [phone] Fax:  [phone]

## 2023-07-20 NOTE — Progress Notes (Incomplete)
HPI: Jonathan White is a 32 y.o. male who presents to the [facility] pharmacy clinic for Apretude administration and HIV PrEP follow up.  Insured   [x]    Uninsured  []    Patient Active Problem List   Diagnosis Date Noted   Diarrhea 10/08/2022   Bipolar affective disorder, depressed, severe (HCC) 12/24/2021   Hemorrhoids, internal, with bleeding 05/15/2021   Vitamin D deficiency disease 04/12/2021   Primary hypertension 04/11/2021   High risk homosexual behavior 04/11/2021   Encounter for general adult medical examination with abnormal findings 04/11/2021   ADHD (attention deficit hyperactivity disorder), inattentive type 04/11/2021   Priapism 02/19/2011   Bipolar disorder (HCC) 02/14/2011    Patient's Medications  New Prescriptions   No medications on file  Previous Medications   CABOTEGRAVIR ER (APRETUDE) 600 MG/3ML INJECTION    Inject 3 mLs (600 mg total) into the muscle every 2 (two) months.   CALCIUM POLYCARBOPHIL (FIBER-CAPS PO)    Take 1 capsule by mouth in the morning and at bedtime.   INDAPAMIDE (LOZOL) 1.25 MG TABLET    Take 1 tablet (1.25 mg total) by mouth daily.  Modified Medications   No medications on file  Discontinued Medications   No medications on file    Allergies: No Known Allergies  Labs: Lab Results  Component Value Date   HIV1RNAQUANT Not Detected 06/16/2023   HIV1RNAQUANT Not Detected 03/31/2023   HIV1RNAQUANT Not Detected 01/20/2023    RPR and STI Lab Results  Component Value Date   LABRPR NON-REACTIVE 06/16/2023   LABRPR NON-REACTIVE 03/31/2023   LABRPR NON-REACTIVE 01/20/2023   LABRPR NON-REACTIVE 12/23/2022   LABRPR NON-REACTIVE 10/08/2022    STI Results GC GC CT CT  06/16/2023 10:52 AM Negative    Negative   Negative    Negative    03/31/2023  3:37 PM Negative    Negative   Negative    Negative    01/20/2023  2:35 PM Negative    Negative    Negative   Negative    Negative    Negative    12/23/2022 11:02 AM Negative    Negative    12/23/2022 10:26 AM Negative   Negative    12/05/2020 12:00 AM Negative   Negative    06/22/2020 12:00 AM Negative   Negative    05/22/2016  2:30 PM  NOT DETECTED   NOT DETECTED   03/21/2016 12:00 AM Negative   Negative      Hepatitis B Lab Results  Component Value Date   HEPBSAG NON-REACTIVE 10/08/2022   HEPBCAB NON-REACTIVE 04/11/2021   Hepatitis C Lab Results  Component Value Date   HEPCAB NON-REACTIVE 04/11/2021   Hepatitis A Lab Results  Component Value Date   HAV BORDERLINE (A) 04/11/2021   Lipids: Lab Results  Component Value Date   CHOL 155 12/24/2021   TRIG 58 12/24/2021   HDL 38 (L) 12/24/2021   CHOLHDL 4.1 12/24/2021   VLDL 12 12/24/2021   LDLCALC 105 (H) 12/24/2021    TARGET DATE: The 30th of the month  Assessment: Jonathan White presents today for his Apretude injection and to follow up for HIV PrEP. No issues with past injections. Did get a nodule with his last injection that was sore for a little bit but did resolve after about a week. Denies any symptoms of acute HIV. Last STI screening was on 06/16/23 and was negative. No known exposures to any STIs since last visit. Agrees to full STI testing today with RPR  and oral/urine cytologies.   Per [employer] guidelines, a rapid HIV test should be drawn prior to Apretude administration. Due to state shortage of rapid HIV tests, this is temporarily unable to be done. Per decision from [facility] physicians, we will proceed with Apretude administration at this time without a negative rapid HIV test beforehand. HIV RNA was collected today and is in process.  Administered cabotegravir 600mg /48mL in left upper outer quadrant of the gluteal muscle. Will see him back in 2 months for injection, labs, and HIV PrEP follow up.  He is due for his third HPV vaccine and influenza vaccine today. He did inquire about the COVID vaccine and I informed him that we do not currently have it at the clinic. Encouraged him to get it  at his next appointment. He was agreeable to getting both the HPV and influenza vaccine today.   Plan: - Apretude injection administered - Received 3rd HPV and influenza vaccine today - HIV RNA, RPR, oral/urine cytologies today - Next injection, labs, and PrEP follow up appointment scheduled for 10/29 and 12/31 with Marchelle Folks - Call with any issues or questions  Lennie Muckle, PharmD PGY1 Pharmacy Resident 07/20/2023 11:10 AM

## 2023-07-21 ENCOUNTER — Other Ambulatory Visit (HOSPITAL_COMMUNITY)
Admission: RE | Admit: 2023-07-21 | Discharge: 2023-07-21 | Disposition: A | Discharge status: Home / Self Care | Payer: BC Managed Care – PPO | Source: Ambulatory Visit | Attending: Infectious Disease | Admitting: Infectious Disease

## 2023-07-21 ENCOUNTER — Other Ambulatory Visit: Payer: Self-pay

## 2023-07-21 ENCOUNTER — Ambulatory Visit (INDEPENDENT_AMBULATORY_CARE_PROVIDER_SITE_OTHER): Payer: BC Managed Care – PPO | Admitting: Pharmacist

## 2023-07-21 DIAGNOSIS — Z113 Encounter for screening for infections with a predominantly sexual mode of transmission: Secondary | ICD-10-CM | POA: Diagnosis present

## 2023-07-21 DIAGNOSIS — Z79899 Other long term (current) drug therapy: Secondary | ICD-10-CM | POA: Diagnosis not present

## 2023-07-21 DIAGNOSIS — Z23 Encounter for immunization: Secondary | ICD-10-CM | POA: Diagnosis not present

## 2023-07-21 MED ORDER — CABOTEGRAVIR ER 600 MG/3ML IM SUER
600.0000 mg | Freq: Once | INTRAMUSCULAR | Status: AC
Start: 2023-07-21 — End: 2023-07-21
  Administered 2023-07-21: 600 mg via INTRAMUSCULAR

## 2023-07-22 ENCOUNTER — Encounter: Payer: Self-pay | Admitting: Pharmacist

## 2023-07-22 LAB — URINE CYTOLOGY ANCILLARY ONLY
Chlamydia: NEGATIVE
Comment: NEGATIVE
Comment: NORMAL
Neisseria Gonorrhea: NEGATIVE

## 2023-07-22 LAB — CYTOLOGY, (ORAL, ANAL, URETHRAL) ANCILLARY ONLY
Chlamydia: NEGATIVE
Comment: NEGATIVE
Comment: NORMAL
Neisseria Gonorrhea: POSITIVE — AB

## 2023-07-24 ENCOUNTER — Other Ambulatory Visit: Payer: Self-pay

## 2023-07-24 ENCOUNTER — Ambulatory Visit (INDEPENDENT_AMBULATORY_CARE_PROVIDER_SITE_OTHER): Payer: BC Managed Care – PPO | Admitting: Pharmacist

## 2023-07-24 DIAGNOSIS — Z2981 Encounter for HIV pre-exposure prophylaxis: Secondary | ICD-10-CM

## 2023-07-24 DIAGNOSIS — Z79899 Other long term (current) drug therapy: Secondary | ICD-10-CM

## 2023-07-24 LAB — HIV-1 RNA QUANT-NO REFLEX-BLD
HIV 1 RNA Quant: NOT DETECTED {copies}/mL
HIV-1 RNA Quant, Log: NOT DETECTED {Log_copies}/mL

## 2023-07-24 LAB — RPR: RPR Ser Ql: NONREACTIVE

## 2023-07-24 MED ORDER — CEFTRIAXONE SODIUM 500 MG IJ SOLR
500.0000 mg | Freq: Once | INTRAMUSCULAR | Status: AC
Start: 2023-07-24 — End: 2023-07-24
  Administered 2023-07-24: 500 mg via INTRAMUSCULAR

## 2023-07-24 NOTE — Progress Notes (Signed)
   [facility]acy STI Visit  HPI: Jonathan White is a 32 y.o. male who presents to the [facility] pharmacy clinic for STI follow-up and treatment.  Hepatitis B No results found for: "HEPBSAB" Lab Results  Component Value Date   HEPBSAG NON-REACTIVE 10/08/2022    Hepatitis C No results found for: "HCVAB"  Hepatitis A Lab Results  Component Value Date   HAV BORDERLINE (A) 04/11/2021    Assessment & Plan: - Administered ceftriaxone 500 mg x 1 for pharyngeal gonorrhea   - Patient tolerated well - Patient aware to refrain from sexual activity x 7 days and to inform partners of test results - Follow up on 9/10 for test of cure   Margarite Gouge, PharmD, CPP, BCIDP, AAHIVP Clinical Pharmacist Practitioner Infectious Diseases Clinical Pharmacist [facility] 07/24/2023, 10:09 AM

## 2023-08-04 ENCOUNTER — Other Ambulatory Visit: Payer: Self-pay

## 2023-08-04 ENCOUNTER — Ambulatory Visit (INDEPENDENT_AMBULATORY_CARE_PROVIDER_SITE_OTHER): Payer: BC Managed Care – PPO | Admitting: Pharmacist

## 2023-08-04 ENCOUNTER — Other Ambulatory Visit (HOSPITAL_COMMUNITY)
Admission: RE | Admit: 2023-08-04 | Discharge: 2023-08-04 | Disposition: A | Discharge status: Home / Self Care | Payer: BC Managed Care – PPO | Source: Ambulatory Visit | Attending: Infectious Disease | Admitting: Infectious Disease

## 2023-08-04 DIAGNOSIS — Z113 Encounter for screening for infections with a predominantly sexual mode of transmission: Secondary | ICD-10-CM | POA: Diagnosis present

## 2023-08-04 NOTE — Progress Notes (Signed)
   [facility] STI Visit  HPI: Jonathan White is a 32 y.o. male who presents to the [facility] pharmacy clinic for STI follow-up.  Hepatitis B No results found for: "HEPBSAB" Lab Results  Component Value Date   HEPBSAG NON-REACTIVE 10/08/2022    Hepatitis C No results found for: "HCVAB"  Hepatitis A Lab Results  Component Value Date   HAV BORDERLINE (A) 04/11/2021    Assessment & Plan: - Recheck oral cytologies today - Patient denies any sexual activity since last visit - Follow up as needed - Follow up with me on 10/29 for Apretude and PrEP follow-up   Margarite Gouge, PharmD, CPP, BCIDP, AAHIVP Clinical Pharmacist Practitioner Infectious Diseases Clinical Pharmacist [facility] 08/04/2023, 10:15 AM

## 2023-08-05 LAB — CYTOLOGY, (ORAL, ANAL, URETHRAL) ANCILLARY ONLY
Chlamydia: NEGATIVE
Comment: NEGATIVE
Comment: NORMAL
Neisseria Gonorrhea: NEGATIVE

## 2023-08-21 ENCOUNTER — Other Ambulatory Visit: Payer: Self-pay | Admitting: Internal Medicine

## 2023-08-21 DIAGNOSIS — Z79899 Other long term (current) drug therapy: Secondary | ICD-10-CM

## 2023-09-18 ENCOUNTER — Telehealth: Payer: Self-pay

## 2023-09-18 NOTE — Telephone Encounter (Signed)
[facility] Patient Advocate Encounter  Patient's medications APRETUDE have been couriered to [facility] from [facility] and will be administered at the patients appointment on 09/22/23.  Kae Heller, CPhT Specialty Pharmacy Patient [facility] Phone: [phone] Fax:  [phone]

## 2023-09-22 ENCOUNTER — Ambulatory Visit: Payer: BC Managed Care – PPO | Admitting: Pharmacist

## 2023-09-22 NOTE — Progress Notes (Unsigned)
HPI: Jonathan White is a 32 y.o. male who presents to the [facility] pharmacy clinic for Apretude administration and HIV PrEP follow up.  Patient Active Problem List   Diagnosis Date Noted   Diarrhea 10/08/2022   Bipolar affective disorder, depressed, severe (HCC) 12/24/2021   Hemorrhoids, internal, with bleeding 05/15/2021   Vitamin D deficiency disease 04/12/2021   Primary hypertension 04/11/2021   High risk homosexual behavior 04/11/2021   Encounter for general adult medical examination with abnormal findings 04/11/2021   ADHD (attention deficit hyperactivity disorder), inattentive type 04/11/2021   Priapism 02/19/2011   Bipolar disorder (HCC) 02/14/2011    Patient's Medications  New Prescriptions   No medications on file  Previous Medications   APRETUDE 600 MG/3ML INJECTION    INJECT 3 ML INTO THE MUSCLE EVERY 2 MONTHS   CALCIUM POLYCARBOPHIL (FIBER-CAPS PO)    Take 1 capsule by mouth in the morning and at bedtime.   INDAPAMIDE (LOZOL) 1.25 MG TABLET    Take 1 tablet (1.25 mg total) by mouth daily.  Modified Medications   No medications on file  Discontinued Medications   No medications on file    Allergies: No Known Allergies  Past Medical History: Past Medical History:  Diagnosis Date   ADHD    Bipolar 1 disorder (HCC)    Bipolar affective disorder (HCC)    Depression     Social History: Social History   Socioeconomic History   Marital status: Married    Spouse name: Not on file   Number of children: Not on file   Years of education: Not on file   Highest education level: Not on file  Occupational History   Occupation: Consulting civil engineer    Employer: [employer]  Tobacco Use   Smoking status: Never    Passive exposure: Never   Smokeless tobacco: Never  Vaping Use   Vaping status: Never Used  Substance and Sexual Activity   Alcohol use: Yes    Alcohol/week: 2.0 standard drinks of alcohol    Types: 2 Cans of beer per week   Drug use: Yes     Frequency: 7.0 times per week    Types: Marijuana, PCP    Comment: smoked marijuana this morning at 10:30am   Sexual activity: Yes    Partners: Male    Birth control/protection: Condom  Other Topics Concern   Not on file  Social History Narrative   ** Merged History Encounter **       Lives with mother   Parents divorced    Current student at [employer]   Part time job at [employer]      1 brother ,  1 sister   Brother hops around, sister lives in [place]      Father has htn   No cancer   Social Determinants of Health   Financial Resource Strain: Not on file  Food Insecurity: Not on file  Transportation Needs: Not on file  Physical Activity: Not on file  Stress: Not on file  Social Connections: Unknown (03/28/2022)   Received from [facility]   Social Network    Social Network: Not on file    Labs: Lab Results  Component Value Date   HIV1RNAQUANT Not Detected 07/21/2023   HIV1RNAQUANT Not Detected 06/16/2023   HIV1RNAQUANT Not Detected 03/31/2023    RPR and STI Lab Results  Component Value Date   LABRPR NON-REACTIVE 07/21/2023   LABRPR NON-REACTIVE 06/16/2023   LABRPR NON-REACTIVE 03/31/2023   LABRPR  NON-REACTIVE 01/20/2023   LABRPR NON-REACTIVE 12/23/2022    STI Results GC GC CT CT  08/04/2023 10:11 AM Negative   Negative    07/21/2023 10:47 AM Positive    Negative   Negative    Negative    06/16/2023 10:52 AM Negative    Negative   Negative    Negative    03/31/2023  3:37 PM Negative    Negative   Negative    Negative    01/20/2023  2:35 PM Negative    Negative    Negative   Negative    Negative    Negative    12/23/2022 11:02 AM Negative   Negative    12/23/2022 10:26 AM Negative   Negative    12/05/2020 12:00 AM Negative   Negative    06/22/2020 12:00 AM Negative   Negative    05/22/2016  2:30 PM  NOT DETECTED   NOT DETECTED   03/21/2016 12:00 AM Negative   Negative      Hepatitis B Lab Results  Component Value Date   HEPBSAG  NON-REACTIVE 10/08/2022   HEPBCAB NON-REACTIVE 04/11/2021   Hepatitis C Lab Results  Component Value Date   HEPCAB NON-REACTIVE 04/11/2021   Hepatitis A Lab Results  Component Value Date   HAV BORDERLINE (A) 04/11/2021   Lipids: Lab Results  Component Value Date   CHOL 155 12/24/2021   TRIG 58 12/24/2021   HDL 38 (L) 12/24/2021   CHOLHDL 4.1 12/24/2021   VLDL 12 12/24/2021   LDLCALC 105 (H) 12/24/2021    TARGET DATE: The 30th of the month  Assessment: Jonathan White presents today for their Apretude injection and to follow up for HIV PrEP. No issues with past injections.  Screened patient for acute HIV symptoms such as fatigue, muscle aches, rash, sore throat, lymphadenopathy, headache, night sweats, nausea/vomiting/diarrhea, and fever. Patient denies any symptoms.   Per [employer] guidelines, a rapid HIV test should be drawn prior to Apretude administration. Due to state shortage of rapid HIV tests, this is temporarily unable to be done. Per decision from [facility] physicians, we will proceed with Apretude administration at this time without a negative rapid HIV test beforehand. HIV RNA was collected today and is in process.  Administered cabotegravir 600mg /19mL in right upper outer quadrant of the gluteal muscle. Will make follow up appointments for maintenance injections every 2 months.   No known exposures to any STIs and no signs or symptoms of any STIs today. Treated for pharyngeal gonorrhea after last visit and test of cure returned negative. No new partners since last injection; will check RPR and urine/oral cytologies today. Accepts updated COVID vaccine as well.   Plan: - Administer Apretude 600 mg x 1  - Maintenance injections scheduled for 12/31 with me  - Check HIV RNA, RPR, and urine/oral cytologies - Administer COVID vaccine  - Call with any issues or questions  Margarite Gouge, PharmD, CPP, BCIDP, AAHIVP Clinical Pharmacist Practitioner Infectious Diseases Clinical  Pharmacist [facility]

## 2023-09-23 ENCOUNTER — Ambulatory Visit (INDEPENDENT_AMBULATORY_CARE_PROVIDER_SITE_OTHER): Payer: BC Managed Care – PPO | Admitting: Pharmacist

## 2023-09-23 ENCOUNTER — Other Ambulatory Visit (HOSPITAL_COMMUNITY)
Admission: RE | Admit: 2023-09-23 | Discharge: 2023-09-23 | Disposition: A | Discharge status: Home / Self Care | Payer: BC Managed Care – PPO | Source: Ambulatory Visit | Attending: Infectious Disease | Admitting: Infectious Disease

## 2023-09-23 ENCOUNTER — Other Ambulatory Visit: Payer: Self-pay

## 2023-09-23 DIAGNOSIS — Z2981 Encounter for HIV pre-exposure prophylaxis: Secondary | ICD-10-CM

## 2023-09-23 DIAGNOSIS — Z23 Encounter for immunization: Secondary | ICD-10-CM

## 2023-09-23 DIAGNOSIS — Z113 Encounter for screening for infections with a predominantly sexual mode of transmission: Secondary | ICD-10-CM | POA: Insufficient documentation

## 2023-09-23 DIAGNOSIS — Z79899 Other long term (current) drug therapy: Secondary | ICD-10-CM

## 2023-09-23 MED ORDER — CABOTEGRAVIR ER 600 MG/3ML IM SUER
600.0000 mg | Freq: Once | INTRAMUSCULAR | Status: AC
Start: 2023-09-23 — End: 2023-09-23
  Administered 2023-09-23: 600 mg via INTRAMUSCULAR

## 2023-09-24 LAB — URINE CYTOLOGY ANCILLARY ONLY
Chlamydia: NEGATIVE
Comment: NEGATIVE
Comment: NORMAL
Neisseria Gonorrhea: NEGATIVE

## 2023-09-24 LAB — CYTOLOGY, (ORAL, ANAL, URETHRAL) ANCILLARY ONLY
Chlamydia: NEGATIVE
Comment: NEGATIVE
Comment: NORMAL
Neisseria Gonorrhea: NEGATIVE

## 2023-09-26 LAB — HIV-1 RNA QUANT-NO REFLEX-BLD
HIV 1 RNA Quant: NOT DETECTED {copies}/mL
HIV-1 RNA Quant, Log: NOT DETECTED {Log_copies}/mL

## 2023-09-26 LAB — RPR: RPR Ser Ql: NONREACTIVE

## 2023-11-19 ENCOUNTER — Telehealth: Payer: Self-pay

## 2023-11-19 NOTE — Telephone Encounter (Signed)
[facility] Patient Advocate Encounter  Patient's medications Apretude have been couriered to [facility] from [facility] and will be administered at the patients appointment on 11/24/23.  Clearance Jonathan White, CPhT Specialty Pharmacy Patient [facility] Phone: [phone] Fax:  [phone]

## 2023-11-20 NOTE — Progress Notes (Signed)
 HPI: Jonathan White is a 32 y.o. male who presents to the [facility] pharmacy clinic for Apretude  administration and HIV PrEP follow up.  Insured   [x]    Uninsured  []    Patient Active Problem List   Diagnosis Date Noted   Diarrhea 10/08/2022   Bipolar affective disorder, depressed, severe (HCC) 12/24/2021   Hemorrhoids, internal, with bleeding 05/15/2021   Vitamin D deficiency disease 04/12/2021   Primary hypertension 04/11/2021   High risk homosexual behavior 04/11/2021   Encounter for general adult medical examination with abnormal findings 04/11/2021   ADHD (attention deficit hyperactivity disorder), inattentive type 04/11/2021   Priapism 02/19/2011   Bipolar disorder (HCC) 02/14/2011    Patient's Medications  New Prescriptions   No medications on file  Previous Medications   APRETUDE  600 MG/3ML INJECTION    INJECT 3 ML INTO THE MUSCLE EVERY 2 MONTHS   CALCIUM POLYCARBOPHIL (FIBER-CAPS PO)    Take 1 capsule by mouth in the morning and at bedtime.   INDAPAMIDE  (LOZOL ) 1.25 MG TABLET    Take 1 tablet (1.25 mg total) by mouth daily.  Modified Medications   No medications on file  Discontinued Medications   No medications on file    Allergies: No Known Allergies  Labs: Lab Results  Component Value Date   HIV1RNAQUANT Not Detected 09/23/2023   HIV1RNAQUANT Not Detected 07/21/2023   HIV1RNAQUANT Not Detected 06/16/2023    RPR and STI Lab Results  Component Value Date   LABRPR NON-REACTIVE 09/23/2023   LABRPR NON-REACTIVE 07/21/2023   LABRPR NON-REACTIVE 06/16/2023   LABRPR NON-REACTIVE 03/31/2023   LABRPR NON-REACTIVE 01/20/2023    STI Results GC GC CT CT  09/23/2023  9:49 AM Negative    Negative   Negative    Negative    08/04/2023 10:11 AM Negative   Negative    07/21/2023 10:47 AM Positive    Negative   Negative    Negative    06/16/2023 10:52 AM Negative    Negative   Negative    Negative    03/31/2023  3:37 PM Negative    Negative   Negative     Negative    01/20/2023  2:35 PM Negative    Negative    Negative   Negative    Negative    Negative    12/23/2022 11:02 AM Negative   Negative    12/23/2022 10:26 AM Negative   Negative    12/05/2020 12:00 AM Negative   Negative    06/22/2020 12:00 AM Negative   Negative    05/22/2016  2:30 PM  NOT DETECTED   NOT DETECTED   03/21/2016 12:00 AM Negative   Negative      Hepatitis B Lab Results  Component Value Date   HEPBSAG NON-REACTIVE 10/08/2022   HEPBCAB NON-REACTIVE 04/11/2021   Hepatitis C Lab Results  Component Value Date   HEPCAB NON-REACTIVE 04/11/2021   Hepatitis A Lab Results  Component Value Date   HAV BORDERLINE (A) 04/11/2021   Lipids: Lab Results  Component Value Date   CHOL 155 12/24/2021   TRIG 58 12/24/2021   HDL 38 (L) 12/24/2021   CHOLHDL 4.1 12/24/2021   VLDL 12 12/24/2021   LDLCALC 105 (H) 12/24/2021    TARGET DATE: 30th  Assessment: Jonathan White presents today for his Apretude  injection and to follow up for HIV PrEP. No issues with past injections. Denies any symptoms of acute HIV. Last STI screening was on 09/23/23 and was negative. No known  exposures to any STIs since last visit. Agrees to STI testing today with RPR and oral/urine cytologies.   Per [employer] guidelines, a rapid HIV test should be drawn prior to Apretude  administration. Due to state shortage of rapid HIV tests, this is temporarily unable to be done. Per decision from [facility] physicians, we will proceed with Apretude  administration at this time without a negative rapid HIV test beforehand. HIV RNA was collected today and is in process.  Administered cabotegravir  600mg /51mL in right upper outer quadrant of the gluteal muscle. Will see him back in 2 months for injection, labs, and HIV PrEP follow up.  Plan: - Apretude  injection administered - HIV RNA, RPR, and oral/urine cytologies today - Next injection, labs, and PrEP follow up appointment scheduled for 01/20/24 with [facility] -  Call with any issues or questions  Shepard Keltz L. Cloys Vera, PharmD, BCIDP, AAHIVP, CPP Clinical Pharmacist Practitioner Infectious Diseases Clinical Pharmacist [facility]

## 2023-11-24 ENCOUNTER — Other Ambulatory Visit: Payer: Self-pay

## 2023-11-24 ENCOUNTER — Ambulatory Visit (INDEPENDENT_AMBULATORY_CARE_PROVIDER_SITE_OTHER): Payer: BC Managed Care – PPO | Admitting: Pharmacist

## 2023-11-24 DIAGNOSIS — Z2981 Encounter for HIV pre-exposure prophylaxis: Secondary | ICD-10-CM | POA: Diagnosis not present

## 2023-11-24 DIAGNOSIS — Z113 Encounter for screening for infections with a predominantly sexual mode of transmission: Secondary | ICD-10-CM

## 2023-11-24 DIAGNOSIS — Z79899 Other long term (current) drug therapy: Secondary | ICD-10-CM

## 2023-11-24 MED ORDER — CABOTEGRAVIR ER 600 MG/3ML IM SUER
600.0000 mg | Freq: Once | INTRAMUSCULAR | Status: AC
  Administered 2023-11-24: 600 mg via INTRAMUSCULAR

## 2023-11-25 LAB — C. TRACHOMATIS/N. GONORRHOEAE RNA
C. trachomatis RNA, TMA: NOT DETECTED
N. gonorrhoeae RNA, TMA: NOT DETECTED

## 2023-11-25 LAB — GC/CHLAMYDIA PROBE, AMP (THROAT)
Chlamydia trachomatis RNA: NOT DETECTED
Neisseria gonorrhoeae RNA: NOT DETECTED

## 2023-11-26 LAB — HIV-1 RNA QUANT-NO REFLEX-BLD
HIV 1 RNA Quant: NOT DETECTED {copies}/mL
HIV-1 RNA Quant, Log: NOT DETECTED {Log}

## 2023-11-26 LAB — RPR: RPR Ser Ql: NONREACTIVE

## 2024-01-05 ENCOUNTER — Other Ambulatory Visit: Payer: Self-pay

## 2024-01-05 ENCOUNTER — Ambulatory Visit: Payer: BC Managed Care – PPO | Admitting: Pharmacist

## 2024-01-05 ENCOUNTER — Other Ambulatory Visit (HOSPITAL_COMMUNITY)
Admission: RE | Admit: 2024-01-05 | Discharge: 2024-01-05 | Disposition: A | Discharge status: Home / Self Care | Payer: BC Managed Care – PPO | Source: Ambulatory Visit | Attending: Infectious Disease | Admitting: Infectious Disease

## 2024-01-05 ENCOUNTER — Ambulatory Visit (INDEPENDENT_AMBULATORY_CARE_PROVIDER_SITE_OTHER): Payer: BC Managed Care – PPO | Admitting: Pharmacist

## 2024-01-05 DIAGNOSIS — Z202 Contact with and (suspected) exposure to infections with a predominantly sexual mode of transmission: Secondary | ICD-10-CM

## 2024-01-05 DIAGNOSIS — Z113 Encounter for screening for infections with a predominantly sexual mode of transmission: Secondary | ICD-10-CM | POA: Insufficient documentation

## 2024-01-05 MED ORDER — CEFTRIAXONE SODIUM 500 MG IJ SOLR
500.0000 mg | Freq: Once | INTRAMUSCULAR | Status: AC
  Administered 2024-01-05: 500 mg via INTRAMUSCULAR

## 2024-01-05 NOTE — Progress Notes (Incomplete)
   01/05/2024  HPI: Jonathan White is a 33 y.o. male who presents to the [facility] clinic today for STI testing.  Patient Active Problem List   Diagnosis Date Noted   Diarrhea 10/08/2022   Bipolar affective disorder, depressed, severe (HCC) 12/24/2021   Hemorrhoids, internal, with bleeding 05/15/2021   Vitamin D deficiency disease 04/12/2021   Primary hypertension 04/11/2021   High risk homosexual behavior 04/11/2021   Encounter for general adult medical examination with abnormal findings 04/11/2021   ADHD (attention deficit hyperactivity disorder), inattentive type 04/11/2021   Priapism 02/19/2011   Bipolar disorder (HCC) 02/14/2011    Patient's Medications  New Prescriptions   No medications on file  Previous Medications   APRETUDE 600 MG/3ML INJECTION    INJECT 3 ML INTO THE MUSCLE EVERY 2 MONTHS   CALCIUM POLYCARBOPHIL (FIBER-CAPS PO)    Take 1 capsule by mouth in the morning and at bedtime.   INDAPAMIDE (LOZOL) 1.25 MG TABLET    Take 1 tablet (1.25 mg total) by mouth daily.  Modified Medications   No medications on file  Discontinued Medications   No medications on file    Assessment: Jonathan White presents to the clinic today for STI testing. He was last seen by [facility] for PrEP follow up and Apretude administration 11/24/23 (Target date 30th). He reported no previous known exposures to STIs but agreed to STI testing (RPR, oral/urine cytologies)  at his last visit as well.   *** Exposure DoxyPEP STI testing ordered: ***  Plan: - STI screening: RPR, urine/rectal/pharyngeal GC/CT swabs for cytology today - Follow up results to see if treatment is needed - Next Apretude injection, labs, and PrEP follow up appointment scheduled for 01/19/23 with Patrcia Dolly, PharmD PGY1 Pharmacy Resident 01/05/2024 6:27 AM

## 2024-01-05 NOTE — Progress Notes (Signed)
   01/05/2024  HPI: Jonathan White is a 33 y.o. male who presents to the [facility] clinic today for STI testing.  Patient Active Problem List   Diagnosis Date Noted   Diarrhea 10/08/2022   Bipolar affective disorder, depressed, severe (HCC) 12/24/2021   Hemorrhoids, internal, with bleeding 05/15/2021   Vitamin D deficiency disease 04/12/2021   Primary hypertension 04/11/2021   High risk homosexual behavior 04/11/2021   Encounter for general adult medical examination with abnormal findings 04/11/2021   ADHD (attention deficit hyperactivity disorder), inattentive type 04/11/2021   Priapism 02/19/2011   Bipolar disorder (HCC) 02/14/2011    Patient's Medications  New Prescriptions   No medications on file  Previous Medications   APRETUDE 600 MG/3ML INJECTION    INJECT 3 ML INTO THE MUSCLE EVERY 2 MONTHS   CALCIUM POLYCARBOPHIL (FIBER-CAPS PO)    Take 1 capsule by mouth in the morning and at bedtime.   INDAPAMIDE (LOZOL) 1.25 MG TABLET    Take 1 tablet (1.25 mg total) by mouth daily.  Modified Medications   No medications on file  Discontinued Medications   No medications on file    Assessment: Pilot presents today for STI testing. Last seen by Cassie 11/24/23 for HIV PrEP follow up and Apretude administration. HIV RNA, RPR, and oral/urine cytologies were collected at last visit and were negative. Next injection/follow up appointment scheduled with Marchelle Folks for 01/20/24.   Jonathan White would like screening before traveling to [place], [place] next week due to reported irritation with urination (denies burning sensation). States he has had sexual encounters without condom use since his last visit. We discussed treatment options if results returned positive. Interested in empiric ceftriaxone for gonorrhea and will administer today to avoid returning before his travel plans if positive for gonorrhea. Agrees to full STI screening today.   Plan: - STI screening: RPR,  urine/rectal/pharyngeal GC/CT swabs for cytology today - Ceftriaxone 500 mg IM x1 given in RUOQ - Follow up results to see if additional treatment is needed - Next Apretude injection with Marchelle Folks 2/26  Stephenie Acres, PharmD PGY1 Pharmacy Resident 01/05/2024 2:16 PM

## 2024-01-06 LAB — CYTOLOGY, (ORAL, ANAL, URETHRAL) ANCILLARY ONLY
Chlamydia: NEGATIVE
Chlamydia: NEGATIVE
Comment: NEGATIVE
Comment: NEGATIVE
Comment: NORMAL
Comment: NORMAL
Neisseria Gonorrhea: NEGATIVE
Neisseria Gonorrhea: NEGATIVE

## 2024-01-06 LAB — URINE CYTOLOGY ANCILLARY ONLY
Chlamydia: NEGATIVE
Comment: NEGATIVE
Comment: NORMAL
Neisseria Gonorrhea: NEGATIVE

## 2024-01-06 LAB — RPR: RPR Ser Ql: NONREACTIVE

## 2024-01-15 ENCOUNTER — Telehealth: Payer: Self-pay

## 2024-01-15 NOTE — Telephone Encounter (Signed)
[facility] Patient Advocate Encounter  Patient's medications Apretude have been couriered to [facility] from [facility] and will be administered at the patients appointment on 01/20/24.  Clearance Coots, CPhT Specialty Pharmacy Patient [facility] for [facility] Phone: [phone] Fax:  [phone]

## 2024-01-20 ENCOUNTER — Ambulatory Visit: Payer: BC Managed Care – PPO | Admitting: Pharmacist

## 2024-01-20 NOTE — Progress Notes (Unsigned)
 HPI: Jonathan White is a 33 y.o. male who presents to the [facility] pharmacy clinic for Apretude administration and HIV PrEP follow up.  Insured   []    Uninsured  []    Patient Active Problem List   Diagnosis Date Noted   Diarrhea 10/08/2022   Bipolar affective disorder, depressed, severe (HCC) 12/24/2021   Hemorrhoids, internal, with bleeding 05/15/2021   Vitamin D deficiency disease 04/12/2021   Primary hypertension 04/11/2021   High risk homosexual behavior 04/11/2021   Encounter for general adult medical examination with abnormal findings 04/11/2021   ADHD (attention deficit hyperactivity disorder), inattentive type 04/11/2021   Priapism 02/19/2011   Bipolar disorder (HCC) 02/14/2011    Patient's Medications  New Prescriptions   No medications on file  Previous Medications   APRETUDE 600 MG/3ML INJECTION    INJECT 3 ML INTO THE MUSCLE EVERY 2 MONTHS   CALCIUM POLYCARBOPHIL (FIBER-CAPS PO)    Take 1 capsule by mouth in the morning and at bedtime.   INDAPAMIDE (LOZOL) 1.25 MG TABLET    Take 1 tablet (1.25 mg total) by mouth daily.  Modified Medications   No medications on file  Discontinued Medications   No medications on file    Allergies: No Known Allergies  Labs: Lab Results  Component Value Date   HIV1RNAQUANT Not Detected 11/24/2023   HIV1RNAQUANT Not Detected 09/23/2023   HIV1RNAQUANT Not Detected 07/21/2023    RPR and STI Lab Results  Component Value Date   LABRPR NON-REACTIVE 01/05/2024   LABRPR NON-REACTIVE 11/24/2023   LABRPR NON-REACTIVE 09/23/2023   LABRPR NON-REACTIVE 07/21/2023   LABRPR NON-REACTIVE 06/16/2023    STI Results GC GC CT CT  01/05/2024  2:43 PM Negative    Negative    Negative   Negative    Negative    Negative    09/23/2023  9:49 AM Negative    Negative   Negative    Negative    08/04/2023 10:11 AM Negative   Negative    07/21/2023 10:47 AM Positive    Negative   Negative    Negative    06/16/2023 10:52 AM Negative     Negative   Negative    Negative    03/31/2023  3:37 PM Negative    Negative   Negative    Negative    01/20/2023  2:35 PM Negative    Negative    Negative   Negative    Negative    Negative    12/23/2022 11:02 AM Negative   Negative    12/23/2022 10:26 AM Negative   Negative    12/05/2020 12:00 AM Negative   Negative    06/22/2020 12:00 AM Negative   Negative    05/22/2016  2:30 PM  NOT DETECTED   NOT DETECTED   03/21/2016 12:00 AM Negative   Negative      Hepatitis B Lab Results  Component Value Date   HEPBSAG NON-REACTIVE 10/08/2022   HEPBCAB NON-REACTIVE 04/11/2021   Hepatitis C Lab Results  Component Value Date   HEPCAB NON-REACTIVE 04/11/2021   Hepatitis A Lab Results  Component Value Date   HAV BORDERLINE (A) 04/11/2021   Lipids: Lab Results  Component Value Date   CHOL 155 12/24/2021   TRIG 58 12/24/2021   HDL 38 (L) 12/24/2021   CHOLHDL 4.1 12/24/2021   VLDL 12 12/24/2021   LDLCALC 105 (H) 12/24/2021    TARGET DATE: ***  Assessment: *** presents today for *** Apretude injection and to  follow up for HIV PrEP. No issues with past injections. Denies any symptoms of acute HIV. Last STI screening was on *** and was ***. No known exposures to any STIs since last visit. ***Agrees to STI testing today. Will check ***.   Per [employer] guidelines, a rapid HIV test should be drawn prior to Apretude administration. Due to state shortage of rapid HIV tests, this is temporarily unable to be done. Per decision from [facility] physicians, we will proceed with Apretude administration at this time without a negative rapid HIV test beforehand. HIV RNA was collected today and is in process.  Administered cabotegravir 600mg /24mL in *** upper outer quadrant of the gluteal muscle. Will see *** back in 2 months for injection, labs, and HIV PrEP follow up.  Plan: - Apretude injection administered - HIV RNA today - Next injection, labs, and PrEP follow up appointment scheduled  for *** - Call with any issues or questions  Cassie L. Kuppelweiser, PharmD, BCIDP, AAHIVP, CPP Clinical Pharmacist Practitioner Infectious Diseases Clinical Pharmacist [facility]

## 2024-01-25 ENCOUNTER — Encounter: Payer: Self-pay | Admitting: Internal Medicine

## 2024-01-25 NOTE — Progress Notes (Unsigned)
 HPI: Jonathan White is a 33 y.o. male who presents to the [facility] pharmacy clinic for Apretude administration and HIV PrEP follow up.  Insured   [x]    Uninsured  []    Patient Active Problem List   Diagnosis Date Noted   Diarrhea 10/08/2022   Bipolar affective disorder, depressed, severe (HCC) 12/24/2021   Hemorrhoids, internal, with bleeding 05/15/2021   Vitamin D deficiency disease 04/12/2021   Primary hypertension 04/11/2021   High risk homosexual behavior 04/11/2021   Encounter for general adult medical examination with abnormal findings 04/11/2021   ADHD (attention deficit hyperactivity disorder), inattentive type 04/11/2021   Priapism 02/19/2011   Bipolar disorder (HCC) 02/14/2011    Patient's Medications  New Prescriptions   No medications on file  Previous Medications   APRETUDE 600 MG/3ML INJECTION    INJECT 3 ML INTO THE MUSCLE EVERY 2 MONTHS   CALCIUM POLYCARBOPHIL (FIBER-CAPS PO)    Take 1 capsule by mouth in the morning and at bedtime.   INDAPAMIDE (LOZOL) 1.25 MG TABLET    Take 1 tablet (1.25 mg total) by mouth daily.  Modified Medications   No medications on file  Discontinued Medications   No medications on file    Allergies: No Known Allergies  Labs: Lab Results  Component Value Date   HIV1RNAQUANT Not Detected 11/24/2023   HIV1RNAQUANT Not Detected 09/23/2023   HIV1RNAQUANT Not Detected 07/21/2023    RPR and STI Lab Results  Component Value Date   LABRPR NON-REACTIVE 01/05/2024   LABRPR NON-REACTIVE 11/24/2023   LABRPR NON-REACTIVE 09/23/2023   LABRPR NON-REACTIVE 07/21/2023   LABRPR NON-REACTIVE 06/16/2023    STI Results GC GC CT CT  01/05/2024  2:43 PM Negative    Negative    Negative   Negative    Negative    Negative    09/23/2023  9:49 AM Negative    Negative   Negative    Negative    08/04/2023 10:11 AM Negative   Negative    07/21/2023 10:47 AM Positive    Negative   Negative    Negative    06/16/2023 10:52 AM Negative     Negative   Negative    Negative    03/31/2023  3:37 PM Negative    Negative   Negative    Negative    01/20/2023  2:35 PM Negative    Negative    Negative   Negative    Negative    Negative    12/23/2022 11:02 AM Negative   Negative    12/23/2022 10:26 AM Negative   Negative    12/05/2020 12:00 AM Negative   Negative    06/22/2020 12:00 AM Negative   Negative    05/22/2016  2:30 PM  NOT DETECTED   NOT DETECTED   03/21/2016 12:00 AM Negative   Negative      Hepatitis B Lab Results  Component Value Date   HEPBSAG NON-REACTIVE 10/08/2022   HEPBCAB NON-REACTIVE 04/11/2021   Hepatitis C Lab Results  Component Value Date   HEPCAB NON-REACTIVE 04/11/2021   Hepatitis A Lab Results  Component Value Date   HAV BORDERLINE (A) 04/11/2021   Lipids: Lab Results  Component Value Date   CHOL 155 12/24/2021   TRIG 58 12/24/2021   HDL 38 (L) 12/24/2021   CHOLHDL 4.1 12/24/2021   VLDL 12 12/24/2021   LDLCALC 105 (H) 12/24/2021    TARGET DATE: The 30th  Assessment: Jonathan White presents today for his Apretude injection and  to follow up for HIV PrEP. No issues with past injections. Denies any symptoms of acute HIV. Last STI screening was on 01/05/24 and was negative. No known exposures to any STIs since last visit but requests STI testing today.  Per [employer] guidelines, a rapid HIV test should be drawn prior to Apretude administration. Due to state shortage of rapid HIV tests, this is temporarily unable to be done. Per decision from [facility] physicians, we will proceed with Apretude administration at this time without a negative rapid HIV test beforehand. HIV RNA was collected today and is in process.  Administered cabotegravir 600mg /71mL in right upper outer quadrant of the gluteal muscle. Will see him back in 2 months for injection, labs, and HIV PrEP follow up.  Plan: - Apretude injection administered - HIV RNA, RPR, and urine/rectal/pharyngeal cytologies for GC/chlamydia today -  Next injection, labs, and PrEP follow up appointment scheduled for 03/22/24 and 05/17/24 with [facility] - Call with any issues or questions  Jonathan White, PharmD, BCIDP, AAHIVP, CPP Clinical Pharmacist Practitioner Infectious Diseases Clinical Pharmacist [facility]

## 2024-01-26 ENCOUNTER — Other Ambulatory Visit (HOSPITAL_COMMUNITY)
Admission: RE | Admit: 2024-01-26 | Discharge: 2024-01-26 | Disposition: A | Discharge status: Home / Self Care | Source: Ambulatory Visit | Attending: Infectious Disease | Admitting: Infectious Disease

## 2024-01-26 ENCOUNTER — Other Ambulatory Visit: Payer: Self-pay

## 2024-01-26 ENCOUNTER — Ambulatory Visit: Admitting: Pharmacist

## 2024-01-26 DIAGNOSIS — Z113 Encounter for screening for infections with a predominantly sexual mode of transmission: Secondary | ICD-10-CM

## 2024-01-26 DIAGNOSIS — Z2981 Encounter for HIV pre-exposure prophylaxis: Secondary | ICD-10-CM | POA: Diagnosis not present

## 2024-01-26 DIAGNOSIS — Z79899 Other long term (current) drug therapy: Secondary | ICD-10-CM

## 2024-01-26 MED ORDER — CABOTEGRAVIR ER 600 MG/3ML IM SUER
600.0000 mg | Freq: Once | INTRAMUSCULAR | Status: AC
Start: 2024-01-26 — End: 2024-01-26
  Administered 2024-01-26: 600 mg via INTRAMUSCULAR

## 2024-01-27 LAB — CYTOLOGY, (ORAL, ANAL, URETHRAL) ANCILLARY ONLY
Chlamydia: NEGATIVE
Chlamydia: NEGATIVE
Comment: NEGATIVE
Comment: NEGATIVE
Comment: NORMAL
Comment: NORMAL
Neisseria Gonorrhea: NEGATIVE
Neisseria Gonorrhea: NEGATIVE

## 2024-01-27 LAB — URINE CYTOLOGY ANCILLARY ONLY
Chlamydia: NEGATIVE
Comment: NEGATIVE
Comment: NORMAL
Neisseria Gonorrhea: NEGATIVE

## 2024-01-29 LAB — HIV-1 RNA QUANT-NO REFLEX-BLD
HIV 1 RNA Quant: NOT DETECTED {copies}/mL
HIV-1 RNA Quant, Log: NOT DETECTED {Log_copies}/mL

## 2024-01-29 LAB — RPR: RPR Ser Ql: NONREACTIVE

## 2024-02-11 ENCOUNTER — Encounter: Payer: Self-pay | Admitting: Internal Medicine

## 2024-02-11 ENCOUNTER — Ambulatory Visit: Payer: BC Managed Care – PPO | Admitting: Internal Medicine

## 2024-02-11 VITALS — BP 136/86 | HR 72 | Temp 97.5°F | Ht 72.0 in | Wt 256.6 lb

## 2024-02-11 DIAGNOSIS — R9431 Abnormal electrocardiogram [ECG] [EKG]: Secondary | ICD-10-CM | POA: Diagnosis not present

## 2024-02-11 DIAGNOSIS — R202 Paresthesia of skin: Secondary | ICD-10-CM | POA: Insufficient documentation

## 2024-02-11 DIAGNOSIS — R0609 Other forms of dyspnea: Secondary | ICD-10-CM | POA: Diagnosis not present

## 2024-02-11 DIAGNOSIS — H9202 Otalgia, left ear: Secondary | ICD-10-CM | POA: Insufficient documentation

## 2024-02-11 DIAGNOSIS — I10 Essential (primary) hypertension: Secondary | ICD-10-CM

## 2024-02-11 DIAGNOSIS — Z0001 Encounter for general adult medical examination with abnormal findings: Secondary | ICD-10-CM

## 2024-02-11 DIAGNOSIS — E6609 Other obesity due to excess calories: Secondary | ICD-10-CM

## 2024-02-11 DIAGNOSIS — Z0000 Encounter for general adult medical examination without abnormal findings: Secondary | ICD-10-CM | POA: Diagnosis not present

## 2024-02-11 DIAGNOSIS — G8929 Other chronic pain: Secondary | ICD-10-CM | POA: Insufficient documentation

## 2024-02-11 DIAGNOSIS — Z6834 Body mass index (BMI) 34.0-34.9, adult: Secondary | ICD-10-CM

## 2024-02-11 DIAGNOSIS — E66811 Obesity, class 1: Secondary | ICD-10-CM | POA: Diagnosis not present

## 2024-02-11 LAB — BASIC METABOLIC PANEL
BUN: 15 mg/dL (ref 6–23)
CO2: 27 meq/L (ref 19–32)
Calcium: 9.3 mg/dL (ref 8.4–10.5)
Chloride: 106 meq/L (ref 96–112)
Creatinine, Ser: 1.08 mg/dL (ref 0.40–1.50)
GFR: 89.65 mL/min (ref >60.00)
Glucose, Bld: 92 mg/dL (ref 70–99)
Potassium: 4.1 meq/L (ref 3.5–5.1)
Sodium: 139 meq/L (ref 135–145)

## 2024-02-11 LAB — BRAIN NATRIURETIC PEPTIDE: Pro B Natriuretic peptide (BNP): 10 pg/mL (ref 0.0–100.0)

## 2024-02-11 LAB — CBC WITH DIFFERENTIAL/PLATELET
Basophils Absolute: 0 10*3/uL (ref 0.0–0.1)
Basophils Relative: 0.4 % (ref 0.0–3.0)
Eosinophils Absolute: 0.2 10*3/uL (ref 0.0–0.7)
Eosinophils Relative: 3 % (ref 0.0–5.0)
HCT: 43.1 % (ref 39.0–52.0)
Hemoglobin: 14.1 g/dL (ref 13.0–17.0)
Lymphocytes Relative: 36.1 % (ref 12.0–46.0)
Lymphs Abs: 2.6 10*3/uL (ref 0.7–4.0)
MCHC: 32.8 g/dL (ref 30.0–36.0)
MCV: 94.5 fl (ref 78.0–100.0)
Monocytes Absolute: 0.5 10*3/uL (ref 0.1–1.0)
Monocytes Relative: 6.9 % (ref 3.0–12.0)
Neutro Abs: 3.9 10*3/uL (ref 1.4–7.7)
Neutrophils Relative %: 53.6 % (ref 43.0–77.0)
Platelets: 294 10*3/uL (ref 150.0–400.0)
RBC: 4.56 Mil/uL (ref 4.22–5.81)
RDW: 12.8 % (ref 11.5–15.5)
WBC: 7.3 10*3/uL (ref 4.0–10.5)

## 2024-02-11 LAB — HEPATIC FUNCTION PANEL
ALT: 26 U/L (ref 0–53)
AST: 15 U/L (ref 0–37)
Albumin: 4.3 g/dL (ref 3.5–5.2)
Alkaline Phosphatase: 53 U/L (ref 39–117)
Bilirubin, Direct: 0.1 mg/dL (ref 0.0–0.3)
Total Bilirubin: 0.5 mg/dL (ref 0.2–1.2)
Total Protein: 7.6 g/dL (ref 6.0–8.3)

## 2024-02-11 LAB — TROPONIN I (HIGH SENSITIVITY): High Sens Troponin I: 11 ng/L (ref 2–17)

## 2024-02-11 LAB — TSH: TSH: 0.41 u[IU]/mL (ref 0.35–5.50)

## 2024-02-11 LAB — HEMOGLOBIN A1C: Hgb A1c MFr Bld: 4.8 % (ref 4.6–6.5)

## 2024-02-11 NOTE — Patient Instructions (Signed)
 Health Maintenance, Male  Adopting a healthy lifestyle and getting preventive care are important in promoting health and wellness. Ask your health care provider about:  The right schedule for you to have regular tests and exams.  Things you can do on your own to prevent diseases and keep yourself healthy.  What should I know about diet, weight, and exercise?  Eat a healthy diet    Eat a diet that includes plenty of vegetables, fruits, low-fat dairy products, and lean protein.  Do not eat a lot of foods that are high in solid fats, added sugars, or sodium.  Maintain a healthy weight  Body mass index (BMI) is a measurement that can be used to identify possible weight problems. It estimates body fat based on height and weight. Your health care provider can help determine your BMI and help you achieve or maintain a healthy weight.  Get regular exercise  Get regular exercise. This is one of the most important things you can do for your health. Most adults should:  Exercise for at least 150 minutes each week. The exercise should increase your heart rate and make you sweat (moderate-intensity exercise).  Do strengthening exercises at least twice a week. This is in addition to the moderate-intensity exercise.  Spend less time sitting. Even light physical activity can be beneficial.  Watch cholesterol and blood lipids  Have your blood tested for lipids and cholesterol at 35 years of age, then have this test every 5 years.  You may need to have your cholesterol levels checked more often if:  Your lipid or cholesterol levels are high.  You are older than 35 years of age.  You are at high risk for heart disease.  What should I know about cancer screening?  Many types of cancers can be detected early and may often be prevented. Depending on your health history and family history, you may need to have cancer screening at various ages. This may include screening for:  Colorectal cancer.  Prostate cancer.  Skin cancer.  Lung  cancer.  What should I know about heart disease, diabetes, and high blood pressure?  Blood pressure and heart disease  High blood pressure causes heart disease and increases the risk of stroke. This is more likely to develop in people who have high blood pressure readings or are overweight.  Talk with your health care provider about your target blood pressure readings.  Have your blood pressure checked:  Every 3-5 years if you are 9-95 years of age.  Every year if you are 85 years old or older.  If you are between the ages of 29 and 29 and are a current or former smoker, ask your health care provider if you should have a one-time screening for abdominal aortic aneurysm (AAA).  Diabetes  Have regular diabetes screenings. This checks your fasting blood sugar level. Have the screening done:  Once every three years after age 23 if you are at a normal weight and have a low risk for diabetes.  More often and at a younger age if you are overweight or have a high risk for diabetes.  What should I know about preventing infection?  Hepatitis B  If you have a higher risk for hepatitis B, you should be screened for this virus. Talk with your health care provider to find out if you are at risk for hepatitis B infection.  Hepatitis C  Blood testing is recommended for:  Everyone born from [birth year] through 1967.  Anyone  with known risk factors for hepatitis C.  Sexually transmitted infections (STIs)  You should be screened each year for STIs, including gonorrhea and chlamydia, if:  You are sexually active and are younger than 35 years of age.  You are older than 35 years of age and your health care provider tells you that you are at risk for this type of infection.  Your sexual activity has changed since you were last screened, and you are at increased risk for chlamydia or gonorrhea. Ask your health care provider if you are at risk.  Ask your health care provider about whether you are at high risk for HIV. Your health care provider  may recommend a prescription medicine to help prevent HIV infection. If you choose to take medicine to prevent HIV, you should first get tested for HIV. You should then be tested every 3 months for as long as you are taking the medicine.  Follow these instructions at home:  Alcohol use  Do not drink alcohol if your health care provider tells you not to drink.  If you drink alcohol:  Limit how much you have to 0-2 drinks a day.  Know how much alcohol is in your drink. In the [place] one drink equals one 12 oz bottle of beer (355 mL), one 5 oz glass of wine (148 mL), or one 1 oz glass of hard liquor (44 mL).  Lifestyle  Do not use any products that contain nicotine or tobacco. These products include cigarettes, chewing tobacco, and vaping devices, such as e-cigarettes. If you need help quitting, ask your health care provider.  Do not use street drugs.  Do not share needles.  Ask your health care provider for help if you need support or information about quitting drugs.  General instructions  Schedule regular health, dental, and eye exams.  Stay current with your vaccines.  Tell your health care provider if:  You often feel depressed.  You have ever been abused or do not feel safe at home.  Summary  Adopting a healthy lifestyle and getting preventive care are important in promoting health and wellness.  Follow your health care provider's instructions about healthy diet, exercising, and getting tested or screened for diseases.  Follow your health care provider's instructions on monitoring your cholesterol and blood pressure.  This information is not intended to replace advice given to you by your health care provider. Make sure you discuss any questions you have with your health care provider.  Document Revised: 04/01/2021 Document Reviewed: 04/01/2021  Elsevier Patient Education  2024 [employer].

## 2024-02-11 NOTE — Progress Notes (Unsigned)
 Subjective:  Patient ID: Jonathan White, male    DOB: June 06, 1991  Age: 33 y.o. MRN: [id number]  CC: Annual Exam and Hypertension   HPI Jonathan White presents for a CPX and f/up ----  Discussed the use of AI scribe software for clinical note transcription with the patient, who gave verbal consent to proceed.  History of Present Illness   Jonathan White is a 33 year old male who presents with tingling and numbness in the left ear and face.  He has experienced intermittent tingling and numbness in his left ear for about a year, with an increase in frequency recently. The numbness has now extended to the left side of his face. He describes the sensation as discomfort rather than pain, and it does not affect his hearing or vision. No associated rash or swollen lymph nodes have been noted.  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 No chest pain, swelling in his arms or legs, or symptoms of high blood pressure such as headaches or blurred vision. Blood pressure was recorded at 138/80.  He has a history of bilateral knee sprains and is currently undergoing physical therapy. He uses ibuprofen occasionally for knee pain and is awaiting approval for an MRI, which is contingent upon completing physical therapy.  He has a history of gynecomastia, which has been present his whole life and is not painful or bothersome. He denies any testicular pain or swelling.       Outpatient Medications Prior to Visit  Medication Sig Dispense Refill   APRETUDE 600 MG/3ML injection INJECT 3 ML INTO THE MUSCLE EVERY 2 MONTHS 3 mL 5   Calcium Polycarbophil (FIBER-CAPS PO) Take 1 capsule by mouth in the morning and at bedtime.     indapamide (LOZOL) 1.25 MG tablet Take 1 tablet (1.25 mg total) by mouth daily. (Patient not taking: Reported on 02/11/2024) 90 tablet 0   No  facility-administered medications prior to visit.    ROS Review of Systems  Constitutional:  Positive for unexpected weight change (wt gain). Negative for appetite change, chills, diaphoresis and fatigue.  HENT:  Positive for ear pain. Negative for ear discharge, facial swelling, sinus pressure and sore throat.   Eyes: Negative.   Respiratory:  Positive for shortness of breath. Negative for cough, chest tightness, wheezing and stridor.   Cardiovascular:  Negative for chest pain, palpitations and leg swelling.  Gastrointestinal:  Negative for abdominal pain, diarrhea and nausea.  Endocrine: Negative.   Genitourinary: Negative.  Negative for difficulty urinating.  Musculoskeletal: Negative.   Skin: Negative.   Neurological:  Negative for dizziness, weakness and headaches.  Hematological:  Negative for adenopathy. Does not bruise/bleed easily.  Psychiatric/Behavioral: Negative.      Objective:  BP 136/86 (BP Location: Right Arm, Patient Position: Sitting, Cuff Size: Large)   Pulse 72   Temp (!) 97.5 F (36.4 C) (Temporal)   Ht 6' (1.829 m)   Wt 256 lb 9.6 oz (116.4 kg)   SpO2 95%   BMI 34.80 kg/m   BP Readings from Last 3 Encounters:  02/11/24 136/86  10/08/22 (!) 134/90  04/14/22 (!) 144/96    Wt Readings from Last 3 Encounters:  02/11/24 256 lb 9.6 oz (116.4 kg)  10/08/22 254 lb 2 oz (115.3 kg)  04/14/22 247 lb (112 kg)    Physical Exam Vitals reviewed.  Constitutional:  Appearance: He is obese. He is not ill-appearing.  HENT:     Head: Normocephalic and atraumatic.     Right Ear: Hearing, tympanic membrane, ear canal and external ear normal.     Left Ear: Hearing, tympanic membrane, ear canal and external ear normal.     Nose: Nose normal.     Mouth/Throat:     Mouth: Mucous membranes are moist.  Eyes:     General: No scleral icterus.    Extraocular Movements: Extraocular movements intact.     Pupils: Pupils are equal, round, and reactive to light.  Neck:      Thyroid: No thyroid mass or thyromegaly.  Cardiovascular:     Rate and Rhythm: Normal rate and regular rhythm.     Heart sounds: No murmur heard.    No friction rub. No gallop.     Comments: EKG ---  NSR, 64 bpm Anterior infarct pattern is not new No LVH or ST/T wave changes Pulmonary:     Breath sounds: No stridor. No wheezing, rhonchi or rales.  Chest:     Chest wall: No mass.  Breasts:    Breasts are symmetrical.     Right: No swelling, bleeding, inverted nipple, mass, nipple discharge, skin change or tenderness.     Left: No swelling, bleeding, inverted nipple, mass, nipple discharge, skin change or tenderness.     Comments: + gynecomastia Abdominal:     General: Abdomen is protuberant.     Palpations: There is no mass.     Tenderness: There is no abdominal tenderness. There is no guarding.     Hernia: No hernia is present.  Musculoskeletal:        General: Normal range of motion.     Cervical back: Neck supple. No tenderness. Normal range of motion.     Right lower leg: No edema.     Left lower leg: No edema.  Lymphadenopathy:     Cervical: No cervical adenopathy.     Right cervical: No superficial, deep or posterior cervical adenopathy.    Left cervical: No superficial, deep or posterior cervical adenopathy.     Upper Body:     Right upper body: No supraclavicular, axillary or pectoral adenopathy.     Left upper body: No supraclavicular, axillary or pectoral adenopathy.  Skin:    General: Skin is warm and dry.     Findings: No lesion or rash.  Neurological:     General: No focal deficit present.     Mental Status: He is alert. Mental status is at baseline.     Cranial Nerves: Cranial nerves 2-12 are intact.     Sensory: Sensation is intact.     Motor: Motor function is intact.     Coordination: Coordination is intact.     Gait: Gait is intact.     Deep Tendon Reflexes: Reflexes normal.  Psychiatric:        Mood and Affect: Mood normal.        Behavior:  Behavior normal.        Judgment: Judgment normal.     Lab Results  Component Value Date   WBC 7.3 02/11/2024   HGB 14.1 02/11/2024   HCT 43.1 02/11/2024   PLT 294.0 02/11/2024   GLUCOSE 92 02/11/2024   CHOL 155 12/24/2021   TRIG 58 12/24/2021   HDL 38 (L) 12/24/2021   LDLCALC 105 (H) 12/24/2021   ALT 26 02/11/2024   AST 15 02/11/2024   NA 139 02/11/2024  K 4.1 02/11/2024   CL 106 02/11/2024   CREATININE 1.08 02/11/2024   BUN 15 02/11/2024   CO2 27 02/11/2024   TSH 0.41 02/11/2024   HGBA1C 4.8 02/11/2024    No results found.  Assessment & Plan:  Primary hypertension -     EKG 12-Lead -     Basic metabolic panel; Future -     CBC with Differential/Platelet; Future -     Hepatic function panel; Future -     TSH; Future -     Urinalysis, Routine w reflex microscopic; Future -     Indapamide; Take 1 tablet (1.25 mg total) by mouth daily.  Dispense: 90 tablet; Refill: 1  Class 1 obesity due to excess calories with serious comorbidity and body mass index (BMI) of 34.0 to 34.9 in adult -     Hepatic function panel; Future -     Hemoglobin A1c; Future -     TSH; Future  Encounter for general adult medical examination with abnormal findings- Exam completed, labs reviewed, vaccines reviewed, no cancer screenings indicated, pt ed material was given.   Abnormal electrocardiogram (ECG) (EKG) -     Troponin I (High Sensitivity); Future -     Brain natriuretic peptide; Future  DOE (dyspnea on exertion)- Labs are EKG are reassuring.  -     Troponin I (High Sensitivity); Future -     Brain natriuretic peptide; Future  Facial paresthesia- Will evaluate for mass, CVA, demyelination. -     MR BRAIN WO CONTRAST; Future  Chronic ear pain, left -     MR BRAIN WO CONTRAST; Future     Follow-up: Return in about 6 months (around 08/13/2024).  Sanda Linger, MD

## 2024-02-12 LAB — URINALYSIS, ROUTINE W REFLEX MICROSCOPIC
Bilirubin Urine: NEGATIVE
Ketones, ur: NEGATIVE
Leukocytes,Ua: NEGATIVE
Nitrite: NEGATIVE
Specific Gravity, Urine: 1.025 (ref 1.000–1.030)
Total Protein, Urine: NEGATIVE
Urine Glucose: NEGATIVE
Urobilinogen, UA: 0.2 (ref 0.0–1.0)
pH: 6 (ref 5.0–8.0)

## 2024-02-12 MED ORDER — INDAPAMIDE 1.25 MG PO TABS: 1.2500 mg | ORAL_TABLET | Freq: Every day | ORAL | 1 refills | Status: AC

## 2024-02-15 ENCOUNTER — Encounter: Payer: Self-pay | Admitting: Internal Medicine

## 2024-03-05 ENCOUNTER — Ambulatory Visit
Admission: RE | Admit: 2024-03-05 | Discharge: 2024-03-05 | Disposition: A | Discharge status: Home / Self Care | Source: Ambulatory Visit | Attending: Internal Medicine | Admitting: Internal Medicine

## 2024-03-05 DIAGNOSIS — G8929 Other chronic pain: Secondary | ICD-10-CM

## 2024-03-05 DIAGNOSIS — R202 Paresthesia of skin: Secondary | ICD-10-CM

## 2024-03-07 ENCOUNTER — Encounter: Payer: Self-pay | Admitting: Internal Medicine

## 2024-03-14 NOTE — Progress Notes (Signed)
Weight loss will help

## 2024-03-15 ENCOUNTER — Telehealth: Payer: Self-pay

## 2024-03-15 NOTE — Telephone Encounter (Signed)
 [facility] Patient Advocate Encounter  Patient's medications APRETUDE  have been couriered to [facility] from [facility] and will be administered at the patients appointment on 03/22/24.  Verline Glow, CPhT Specialty Pharmacy Patient [facility] Phone: [phone] Fax:  [phone]

## 2024-03-21 NOTE — Progress Notes (Unsigned)
 HPI: Jonathan White is a 33 y.o. male who presents to the [facility] pharmacy clinic for Apretude  administration and HIV PrEP follow up.  Insured   [x]    Uninsured  []    Patient Active Problem List   Diagnosis Date Noted   Class 1 obesity due to excess calories with serious comorbidity and body mass index (BMI) of 34.0 to 34.9 in adult 02/11/2024   Abnormal electrocardiogram (ECG) (EKG) 02/11/2024   DOE (dyspnea on exertion) 02/11/2024   Left ear pain 02/11/2024   Facial paresthesia 02/11/2024   Chronic ear pain, left 02/11/2024   Diarrhea 10/08/2022   Bipolar affective disorder, depressed, severe (HCC) 12/24/2021   Hemorrhoids, internal, with bleeding 05/15/2021   Vitamin D deficiency disease 04/12/2021   Primary hypertension 04/11/2021   High risk homosexual behavior 04/11/2021   Encounter for general adult medical examination with abnormal findings 04/11/2021   ADHD (attention deficit hyperactivity disorder), 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   INDAPAMIDE  (LOZOL ) 1.25 MG TABLET    Take 1 tablet (1.25 mg total) by mouth daily.  Modified Medications   No medications on file  Discontinued Medications   No medications on file    Allergies: No Known Allergies  Labs: Lab Results  Component Value Date   HIV1RNAQUANT Not Detected 01/26/2024   HIV1RNAQUANT Not Detected 11/24/2023   HIV1RNAQUANT Not Detected 09/23/2023    RPR and STI Lab Results  Component Value Date   LABRPR NON-REACTIVE 01/26/2024   LABRPR NON-REACTIVE 01/05/2024   LABRPR NON-REACTIVE 11/24/2023   LABRPR NON-REACTIVE 09/23/2023   LABRPR NON-REACTIVE  07/21/2023    STI Results GC GC CT CT  01/26/2024 10:05 AM Negative    Negative    Negative   Negative    Negative    Negative    01/05/2024  2:43 PM Negative    Negative    Negative   Negative    Negative    Negative    09/23/2023  9:49 AM Negative    Negative   Negative    Negative    08/04/2023 10:11 AM Negative   Negative    07/21/2023 10:47 AM Positive    Negative   Negative    Negative    06/16/2023 10:52 AM Negative    Negative   Negative    Negative    03/31/2023  3:37 PM Negative    Negative   Negative    Negative    01/20/2023  2:35 PM Negative    Negative    Negative   Negative    Negative    Negative    12/23/2022 11:02 AM Negative   Negative    12/23/2022 10:26 AM Negative   Negative    12/05/2020 12:00 AM Negative   Negative    06/22/2020 12:00 AM Negative   Negative    05/22/2016  2:30 PM  NOT DETECTED   NOT DETECTED   03/21/2016 12:00 AM Negative   Negative      Hepatitis B Lab Results  Component Value Date   HEPBSAG NON-REACTIVE 10/08/2022   HEPBCAB NON-REACTIVE 04/11/2021  Hepatitis C Lab Results  Component Value Date   HEPCAB NON-REACTIVE 04/11/2021   Hepatitis A Lab Results  Component Value Date   HAV BORDERLINE (A) 04/11/2021   Lipids: Lab Results  Component Value Date   CHOL 155 12/24/2021   TRIG 58 12/24/2021   HDL 38 (L) 12/24/2021   CHOLHDL 4.1 12/24/2021   VLDL 12 12/24/2021   LDLCALC 105 (H) 12/24/2021    TARGET DATE: 30  Assessment: Jonathan White presents today for q62mo Apretude  injection and to follow up for HIV PrEP. No issues with past injections. Denies any symptoms of acute HIV. Last STI screening was on 01/26/2024 and was negative. No known exposures to any STIs since last visit. Agrees to STI testing today.    Per [employer] guidelines, a rapid HIV test should be drawn prior to Apretude  administration. Due to state shortage of rapid HIV tests, this is temporarily unable to be done. Per decision from [facility]  physicians, we will proceed with Apretude  administration at this time without a negative rapid HIV test beforehand. HIV RNA was collected today and is in process.  Administered cabotegravir  600mg /47mL in right upper outer quadrant of the gluteal muscle. Will see [name] back in 2 months for injection, labs, and HIV PrEP follow up.  Up to date on all vaccines.   Plan: - Apretude  injection administered - HIV RNA today - STI testing today: RPR, urine/oral/rectal cytologies today - Next injection, labs, and PrEP follow up appointment scheduled for 05/17/2024 with [name] - Call with any issues or questions  Kristopher Pheasant PharmD Candidate

## 2024-03-22 ENCOUNTER — Ambulatory Visit (INDEPENDENT_AMBULATORY_CARE_PROVIDER_SITE_OTHER): Admitting: Pharmacist

## 2024-03-22 ENCOUNTER — Other Ambulatory Visit (HOSPITAL_COMMUNITY)
Admission: RE | Admit: 2024-03-22 | Discharge: 2024-03-22 | Disposition: A | Discharge status: Home / Self Care | Source: Ambulatory Visit | Attending: Infectious Disease | Admitting: Infectious Disease

## 2024-03-22 ENCOUNTER — Other Ambulatory Visit: Payer: Self-pay

## 2024-03-22 DIAGNOSIS — Z9189 Other specified personal risk factors, not elsewhere classified: Secondary | ICD-10-CM | POA: Insufficient documentation

## 2024-03-22 DIAGNOSIS — Z79899 Other long term (current) drug therapy: Secondary | ICD-10-CM

## 2024-03-22 DIAGNOSIS — Z113 Encounter for screening for infections with a predominantly sexual mode of transmission: Secondary | ICD-10-CM | POA: Insufficient documentation

## 2024-03-22 DIAGNOSIS — Z2981 Encounter for HIV pre-exposure prophylaxis: Secondary | ICD-10-CM

## 2024-03-22 MED ORDER — CABOTEGRAVIR ER 600 MG/3ML IM SUER
600.0000 mg | Freq: Once | INTRAMUSCULAR | Status: AC
  Administered 2024-03-22: 600 mg via INTRAMUSCULAR

## 2024-03-23 LAB — CYTOLOGY, (ORAL, ANAL, URETHRAL) ANCILLARY ONLY
Chlamydia: NEGATIVE
Chlamydia: NEGATIVE
Comment: NEGATIVE
Comment: NEGATIVE
Comment: NORMAL
Comment: NORMAL
Neisseria Gonorrhea: NEGATIVE
Neisseria Gonorrhea: NEGATIVE

## 2024-03-23 LAB — URINE CYTOLOGY ANCILLARY ONLY
Chlamydia: NEGATIVE
Comment: NEGATIVE
Comment: NORMAL
Neisseria Gonorrhea: NEGATIVE

## 2024-03-24 LAB — HIV-1 RNA QUANT-NO REFLEX-BLD
HIV 1 RNA Quant: NOT DETECTED {copies}/mL
HIV-1 RNA Quant, Log: NOT DETECTED {Log_copies}/mL

## 2024-03-24 LAB — RPR: RPR Ser Ql: NONREACTIVE

## 2024-04-05 ENCOUNTER — Encounter: Admitting: Internal Medicine

## 2024-05-10 ENCOUNTER — Telehealth: Payer: Self-pay

## 2024-05-10 NOTE — Telephone Encounter (Addendum)
 [facility] Patient Advocate Encounter  Patient's medications Apretude  have been couriered to [facility] from [facility] and will be administered at the patients appointment on 05/17/24.  Roylene Corn, CPhT Specialty Pharmacy Patient [facility] Phone: [phone] Fax:  [phone]

## 2024-05-12 NOTE — Progress Notes (Unsigned)
 HPI: Jonathan White is a 33 y.o. male who presents to the [facility] pharmacy clinic for Apretude  administration and HIV PrEP follow up.  Patient Active Problem List   Diagnosis Date Noted   Class 1 obesity due to excess calories with serious comorbidity and body mass index (BMI) of 34.0 to 34.9 in adult 02/11/2024   Abnormal electrocardiogram (ECG) (EKG) 02/11/2024   DOE (dyspnea on exertion) 02/11/2024   Left ear pain 02/11/2024   Facial paresthesia 02/11/2024   Chronic ear pain, left 02/11/2024   Diarrhea 10/08/2022   Bipolar affective disorder, depressed, severe (HCC) 12/24/2021   Hemorrhoids, internal, with bleeding 05/15/2021   Vitamin D deficiency disease 04/12/2021   Primary hypertension 04/11/2021   High risk homosexual behavior 04/11/2021   Encounter for general adult medical examination with abnormal findings 04/11/2021   ADHD (attention deficit hyperactivity disorder), 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   INDAPAMIDE  (LOZOL ) 1.25 MG TABLET    Take 1 tablet (1.25 mg total) by mouth daily.  Modified Medications   No medications on file  Discontinued Medications   No medications on file    Allergies: No Known Allergies  Past Medical History: Past Medical History:  Diagnosis Date   ADHD    Bipolar 1 disorder (HCC)    Bipolar affective disorder (HCC)    Depression     Social History: Social History   Socioeconomic History   Marital status: Married    Spouse name: Not on file   Number of children: Not on file   Years of education: Not on file   Highest education level: Not on file  Occupational History    Occupation: Consulting civil engineer    Employer: [employer]  Tobacco Use   Smoking status: Never    Passive exposure: Never   Smokeless tobacco: Never  Vaping Use   Vaping status: Never Used  Substance and Sexual Activity   Alcohol use: Yes    Alcohol/week: 2.0 standard drinks of alcohol    Types: 2 Cans of beer per week   Drug use: Yes    Frequency: 7.0 times per week    Types: Marijuana, PCP    Comment: smoked marijuana this morning at 10:30am   Sexual activity: Yes    Partners: Male    Birth control/protection: Condom  Other Topics Concern   Not on file  Social History Narrative   ** Merged History Encounter **       Lives with mother   Parents divorced    Current student at [employer]   Part time job at [employer]      1 brother ,  1 sister   Brother hops around, sister lives in [place]      Father has htn   No cancer   Social Drivers of Corporate investment banker Strain: Not on file  Food Insecurity: Not on file  Transportation Needs: Not on file  Physical Activity: Not on file  Stress: Not on file  Social Connections: Unknown (03/28/2022)   Received from [facility]   Social Network    Social Network: Not on  file    Labs: Lab Results  Component Value Date   HIV1RNAQUANT NOT DETECTED 03/22/2024   HIV1RNAQUANT Not Detected 01/26/2024   HIV1RNAQUANT Not Detected 11/24/2023    RPR and STI Lab Results  Component Value Date   LABRPR NON-REACTIVE 03/22/2024   LABRPR NON-REACTIVE 01/26/2024   LABRPR NON-REACTIVE 01/05/2024   LABRPR NON-REACTIVE 11/24/2023   LABRPR NON-REACTIVE 09/23/2023    STI Results GC GC CT CT  03/22/2024 11:03 AM Negative    Negative    Negative   Negative    Negative    Negative    01/26/2024 10:05 AM Negative    Negative    Negative   Negative    Negative    Negative    01/05/2024  2:43 PM Negative    Negative    Negative   Negative    Negative    Negative    09/23/2023  9:49 AM Negative    Negative   Negative    Negative     08/04/2023 10:11 AM Negative   Negative    07/21/2023 10:47 AM Positive    Negative   Negative    Negative    06/16/2023 10:52 AM Negative    Negative   Negative    Negative    03/31/2023  3:37 PM Negative    Negative   Negative    Negative    01/20/2023  2:35 PM Negative    Negative    Negative   Negative    Negative    Negative    12/23/2022 11:02 AM Negative   Negative    12/23/2022 10:26 AM Negative   Negative    12/05/2020 12:00 AM Negative   Negative    06/22/2020 12:00 AM Negative   Negative    05/22/2016  2:30 PM  NOT DETECTED   NOT DETECTED   03/21/2016 12:00 AM Negative   Negative      Hepatitis B Lab Results  Component Value Date   HEPBSAG NON-REACTIVE 10/08/2022   HEPBCAB NON-REACTIVE 04/11/2021   Hepatitis C Lab Results  Component Value Date   HEPCAB NON-REACTIVE 04/11/2021   Hepatitis A Lab Results  Component Value Date   HAV BORDERLINE (A) 04/11/2021   Lipids: Lab Results  Component Value Date   CHOL 155 12/24/2021   TRIG 58 12/24/2021   HDL 38 (L) 12/24/2021   CHOLHDL 4.1 12/24/2021   VLDL 12 12/24/2021   LDLCALC 105 (H) 12/24/2021    TARGET DATE: The 30th of the month  Assessment: Jonathan White presents today for their Apretude  injection and to follow up for HIV PrEP. No issues with past injections.  Screened patient for acute HIV symptoms such as fatigue, muscle aches, rash, sore throat, lymphadenopathy, headache, night sweats, nausea/vomiting/diarrhea, and fever. Patient denies any symptoms.   Per [employer] guidelines, a rapid HIV test should be drawn prior to Apretude  administration. Due to state shortage of rapid HIV tests, this is temporarily unable to be done. Per decision from [facility] physicians, we will proceed with Apretude  administration at this time without a negative rapid HIV test beforehand. HIV RNA was collected today and is in process.  Administered cabotegravir  600mg /97mL in *** upper outer quadrant of the gluteal muscle. Will  make follow up appointments for maintenance injections every 2 months.   No known exposures to any STIs and no signs or symptoms of any STIs today. Last STI screening was in April and was negative. *** new partners since last injection; will check ***  today.   Plan: - Administer Apretude  600 mg x 1  - Maintenance injections scheduled for *** - Check HIV RNA and *** - Call with any issues or questions  Nicklas Barns, PharmD, CPP, BCIDP, AAHIVP Clinical Pharmacist Practitioner Infectious Diseases Clinical Pharmacist [facility]

## 2024-05-17 ENCOUNTER — Other Ambulatory Visit: Payer: Self-pay

## 2024-05-17 ENCOUNTER — Other Ambulatory Visit (HOSPITAL_COMMUNITY)
Admission: RE | Admit: 2024-05-17 | Discharge: 2024-05-17 | Disposition: A | Discharge status: Home / Self Care | Source: Ambulatory Visit | Attending: Infectious Disease | Admitting: Infectious Disease

## 2024-05-17 ENCOUNTER — Ambulatory Visit (INDEPENDENT_AMBULATORY_CARE_PROVIDER_SITE_OTHER): Admitting: Pharmacist

## 2024-05-17 DIAGNOSIS — Z113 Encounter for screening for infections with a predominantly sexual mode of transmission: Secondary | ICD-10-CM | POA: Insufficient documentation

## 2024-05-17 DIAGNOSIS — Z2981 Encounter for HIV pre-exposure prophylaxis: Secondary | ICD-10-CM | POA: Diagnosis not present

## 2024-05-17 DIAGNOSIS — Z79899 Other long term (current) drug therapy: Secondary | ICD-10-CM

## 2024-05-17 MED ORDER — CABOTEGRAVIR ER 600 MG/3ML IM SUER
600.0000 mg | Freq: Once | INTRAMUSCULAR | Status: AC
  Administered 2024-05-17: 600 mg via INTRAMUSCULAR

## 2024-05-18 LAB — URINE CYTOLOGY ANCILLARY ONLY
Chlamydia: NEGATIVE
Comment: NEGATIVE
Comment: NORMAL
Neisseria Gonorrhea: NEGATIVE

## 2024-05-18 LAB — CYTOLOGY, (ORAL, ANAL, URETHRAL) ANCILLARY ONLY
Chlamydia: NEGATIVE
Comment: NEGATIVE
Comment: NORMAL
Neisseria Gonorrhea: NEGATIVE

## 2024-05-19 LAB — RPR: RPR Ser Ql: NONREACTIVE

## 2024-05-19 LAB — HIV-1 RNA QUANT-NO REFLEX-BLD
HIV 1 RNA Quant: NOT DETECTED {copies}/mL
HIV-1 RNA Quant, Log: NOT DETECTED {Log_copies}/mL

## 2024-06-09 ENCOUNTER — Ambulatory Visit: Admitting: Internal Medicine

## 2024-06-09 ENCOUNTER — Encounter: Payer: Self-pay | Admitting: Internal Medicine

## 2024-06-09 VITALS — BP 134/86 | HR 83 | Temp 98.5°F | Resp 16 | Ht 72.0 in | Wt 260.0 lb

## 2024-06-09 DIAGNOSIS — G8929 Other chronic pain: Secondary | ICD-10-CM

## 2024-06-09 DIAGNOSIS — R0602 Shortness of breath: Secondary | ICD-10-CM | POA: Diagnosis not present

## 2024-06-09 DIAGNOSIS — F902 Attention-deficit hyperactivity disorder, combined type: Secondary | ICD-10-CM

## 2024-06-09 DIAGNOSIS — M545 Low back pain, unspecified: Secondary | ICD-10-CM

## 2024-06-09 DIAGNOSIS — I10 Essential (primary) hypertension: Secondary | ICD-10-CM | POA: Diagnosis not present

## 2024-06-09 DIAGNOSIS — R0789 Other chest pain: Secondary | ICD-10-CM | POA: Diagnosis not present

## 2024-06-09 DIAGNOSIS — F900 Attention-deficit hyperactivity disorder, predominantly inattentive type: Secondary | ICD-10-CM

## 2024-06-09 NOTE — Progress Notes (Signed)
 Subjective:  Patient ID: Jonathan White, male    DOB: [birth year]  Age: 33 y.o. MRN: [id number]  CC: Hypertension and Back Pain   HPI Jonathan White presents for f/up ---  Discussed the use of AI scribe software for clinical note transcription with the patient, who gave verbal consent to proceed.  History of Present Illness   Jonathan White is a 33 year old male with hypertension who presents with chest pain and shortness of breath.  He experiences shortness of breath and chest tightness during activities such as cleaning a carpet and walking up and down stairs. These symptoms have been consistent and were present even before his last visit, though he did not mention them at that time. He describes the symptoms as 'normal' for him, which is why he initially did not bring them up.  He has not been taking his prescribed medication, indapamide , for high blood pressure for the past two weeks, with the last dose taken at the start of July. He reports no side effects from the medication when he was taking it, having taken it consistently for about four days before stopping.  He also mentions experiencing back pain, which has been evaluated with an MRI that indicated a disc issue. He notes tightness and sensitivity in his shoulder area. For pain management, he has been using over-the-counter medications like aspirin.  No headache, blurred vision, new numbness, weakness, or tingling. He also mentions a mole on his arm, which he is unsure about.       Outpatient Medications Prior to Visit  Medication Sig Dispense Refill   APRETUDE  600 MG/3ML injection INJECT 3 ML INTO THE MUSCLE EVERY 2 MONTHS 3 mL 5   Calcium Polycarbophil (FIBER-CAPS PO) Take 1 capsule by mouth in the morning and at bedtime.     indapamide  (LOZOL ) 1.25 MG tablet Take 1 tablet (1.25 mg total) by mouth daily. 90 tablet 1   No facility-administered medications prior to visit.    ROS Review of Systems   Constitutional:  Negative for appetite change, chills, diaphoresis, fatigue and fever.  Respiratory: Negative.  Negative for cough, chest tightness, shortness of breath and wheezing.   Cardiovascular:  Negative for chest pain, palpitations and leg swelling.  Gastrointestinal: Negative.  Negative for abdominal pain, constipation, diarrhea, nausea and vomiting.  Genitourinary: Negative.   Musculoskeletal:  Positive for back pain. Negative for arthralgias and myalgias.  Skin: Negative.   Neurological: Negative.  Negative for dizziness, weakness, light-headedness and numbness.  Hematological:  Negative for adenopathy. Does not bruise/bleed easily.  Psychiatric/Behavioral:  Positive for decreased concentration. Negative for dysphoric mood and sleep disturbance. The patient is not nervous/anxious.     Objective:  BP 134/86 (BP Location: Left Arm, Patient Position: Sitting, Cuff Size: Normal)   Pulse 83   Temp 98.5 F (36.9 C) (Oral)   Resp 16   Ht 6' (1.829 m)   Wt 260 lb (117.9 kg)   SpO2 98%   BMI 35.26 kg/m   BP Readings from Last 3 Encounters:  06/09/24 134/86  02/11/24 136/86  10/08/22 (!) 134/90    Wt Readings from Last 3 Encounters:  06/09/24 260 lb (117.9 kg)  02/11/24 256 lb 9.6 oz (116.4 kg)  10/08/22 254 lb 2 oz (115.3 kg)    Physical Exam Vitals reviewed.  HENT:     Mouth/Throat:     Mouth: Mucous membranes are moist.  Eyes:     General: No scleral icterus.  Conjunctiva/sclera: Conjunctivae normal.  Cardiovascular:     Rate and Rhythm: Normal rate and regular rhythm.     Heart sounds: No murmur heard.    No friction rub. No gallop.     Comments: EKG- NSR, 60 bpm Anterior infarct pattern is not new No LVH or acute ST/T wave changes Unchanged  Pulmonary:     Effort: Pulmonary effort is normal.     Breath sounds: No stridor. No wheezing, rhonchi or rales.  Abdominal:     General: Abdomen is protuberant. There is no distension.     Palpations: There is  no mass.     Tenderness: There is no abdominal tenderness. There is no guarding.     Hernia: No hernia is present.  Musculoskeletal:     Cervical back: Neck supple.     Right lower leg: No edema.     Left lower leg: No edema.  Lymphadenopathy:     Cervical: No cervical adenopathy.  Skin:    General: Skin is warm and dry.  Neurological:     General: No focal deficit present.     Mental Status: He is alert. Mental status is at baseline.  Psychiatric:        Mood and Affect: Mood normal.        Behavior: Behavior normal.     Lab Results  Component Value Date   WBC 7.3 02/11/2024   HGB 14.1 02/11/2024   HCT 43.1 02/11/2024   PLT 294.0 02/11/2024   GLUCOSE 92 02/11/2024   CHOL 155 12/24/2021   TRIG 58 12/24/2021   HDL 38 (L) 12/24/2021   LDLCALC 105 (H) 12/24/2021   ALT 26 02/11/2024   AST 15 02/11/2024   NA 139 02/11/2024   K 4.1 02/11/2024   CL 106 02/11/2024   CREATININE 1.08 02/11/2024   BUN 15 02/11/2024   CO2 27 02/11/2024   TSH 0.41 02/11/2024   HGBA1C 4.8 02/11/2024    No results found.  Assessment & Plan:  Atypical chest pain -     EKG 12-Lead  SOB (shortness of breath) -     EKG 12-Lead  Chronic bilateral low back pain without sciatica -     Ambulatory referral to Physical Medicine Rehab  Attention deficit hyperactivity disorder (ADHD), combined type  ADHD (attention deficit hyperactivity disorder), inattentive type  Primary hypertension- He has not achieved his BP goal. He agrees to be more complaint with the thiazide diuretic.     Follow-up: Return in about 6 months (around 12/10/2024).  Debby Molt, MD

## 2024-06-09 NOTE — Patient Instructions (Signed)
 Hypertension, Adult High blood pressure (hypertension) is when the force of blood pumping through the arteries is too strong. The arteries are the blood vessels that carry blood from the heart throughout the body. Hypertension forces the heart to work harder to pump blood and may cause arteries to become narrow or stiff. Untreated or uncontrolled hypertension can lead to a heart attack, heart failure, a stroke, kidney disease, and other problems. A blood pressure reading consists of a higher number over a lower number. Ideally, your blood pressure should be below 120/80. The first ("top") number is called the systolic pressure. It is a measure of the pressure in your arteries as your heart beats. The second ("bottom") number is called the diastolic pressure. It is a measure of the pressure in your arteries as the heart relaxes. What are the causes? The exact cause of this condition is not known. There are some conditions that result in high blood pressure. What increases the risk? Certain factors may make you more likely to develop high blood pressure. Some of these risk factors are under your control, including: Smoking. Not getting enough exercise or physical activity. Being overweight. Having too much fat, sugar, calories, or salt (sodium) in your diet. Drinking too much alcohol. Other risk factors include: Having a personal history of heart disease, diabetes, high cholesterol, or kidney disease. Stress. Having a family history of high blood pressure and high cholesterol. Having obstructive sleep apnea. Age. The risk increases with age. What are the signs or symptoms? High blood pressure may not cause symptoms. Very high blood pressure (hypertensive crisis) may cause: Headache. Fast or irregular heartbeats (palpitations). Shortness of breath. Nosebleed. Nausea and vomiting. Vision changes. Severe chest pain, dizziness, and seizures. How is this diagnosed? This condition is diagnosed by  measuring your blood pressure while you are seated, with your arm resting on a flat surface, your legs uncrossed, and your feet flat on the floor. The cuff of the blood pressure monitor will be placed directly against the skin of your upper arm at the level of your heart. Blood pressure should be measured at least twice using the same arm. Certain conditions can cause a difference in blood pressure between your right and left arms. If you have a high blood pressure reading during one visit or you have normal blood pressure with other risk factors, you may be asked to: Return on a different day to have your blood pressure checked again. Monitor your blood pressure at home for 1 week or longer. If you are diagnosed with hypertension, you may have other blood or imaging tests to help your health care provider understand your overall risk for other conditions. How is this treated? This condition is treated by making healthy lifestyle changes, such as eating healthy foods, exercising more, and reducing your alcohol intake. You may be referred for counseling on a healthy diet and physical activity. Your health care provider may prescribe medicine if lifestyle changes are not enough to get your blood pressure under control and if: Your systolic blood pressure is above 130. Your diastolic blood pressure is above 80. Your personal target blood pressure may vary depending on your medical conditions, your age, and other factors. Follow these instructions at home: Eating and drinking  Eat a diet that is high in fiber and potassium, and low in sodium, added sugar, and fat. An example of this eating plan is called the DASH diet. DASH stands for Dietary Approaches to Stop Hypertension. To eat this way: Eat  plenty of fresh fruits and vegetables. Try to fill one half of your plate at each meal with fruits and vegetables. Eat whole grains, such as whole-wheat pasta, brown rice, or whole-grain bread. Fill about one  fourth of your plate with whole grains. Eat or drink low-fat dairy products, such as skim milk or low-fat yogurt. Avoid fatty cuts of meat, processed or cured meats, and poultry with skin. Fill about one fourth of your plate with lean proteins, such as fish, chicken without skin, beans, eggs, or tofu. Avoid pre-made and processed foods. These tend to be higher in sodium, added sugar, and fat. Reduce your daily sodium intake. Many people with hypertension should eat less than 1,500 mg of sodium a day. Do not drink alcohol if: Your health care provider tells you not to drink. You are pregnant, may be pregnant, or are planning to become pregnant. If you drink alcohol: Limit how much you have to: 0-1 drink a day for women. 0-2 drinks a day for men. Know how much alcohol is in your drink. In the [place] one drink equals one 12 oz bottle of beer (355 mL), one 5 oz glass of wine (148 mL), or one 1 oz glass of hard liquor (44 mL). Lifestyle  Work with your health care provider to maintain a healthy body weight or to lose weight. Ask what an ideal weight is for you. Get at least 30 minutes of exercise that causes your heart to beat faster (aerobic exercise) most days of the week. Activities may include walking, swimming, or biking. Include exercise to strengthen your muscles (resistance exercise), such as Pilates or lifting weights, as part of your weekly exercise routine. Try to do these types of exercises for 30 minutes at least 3 days a week. Do not use any products that contain nicotine or tobacco. These products include cigarettes, chewing tobacco, and vaping devices, such as e-cigarettes. If you need help quitting, ask your health care provider. Monitor your blood pressure at home as told by your health care provider. Keep all follow-up visits. This is important. Medicines Take over-the-counter and prescription medicines only as told by your health care provider. Follow directions carefully. Blood  pressure medicines must be taken as prescribed. Do not skip doses of blood pressure medicine. Doing this puts you at risk for problems and can make the medicine less effective. Ask your health care provider about side effects or reactions to medicines that you should watch for. Contact a health care provider if you: Think you are having a reaction to a medicine you are taking. Have headaches that keep coming back (recurring). Feel dizzy. Have swelling in your ankles. Have trouble with your vision. Get help right away if you: Develop a severe headache or confusion. Have unusual weakness or numbness. Feel faint. Have severe pain in your chest or abdomen. Vomit repeatedly. Have trouble breathing. These symptoms may be an emergency. Get help right away. Call 911. Do not wait to see if the symptoms will go away. Do not drive yourself to the hospital. Summary Hypertension is when the force of blood pumping through your arteries is too strong. If this condition is not controlled, it may put you at risk for serious complications. Your personal target blood pressure may vary depending on your medical conditions, your age, and other factors. For most people, a normal blood pressure is less than 120/80. Hypertension is treated with lifestyle changes, medicines, or a combination of both. Lifestyle changes include losing weight, eating a healthy,  low-sodium diet, exercising more, and limiting alcohol. This information is not intended to replace advice given to you by your health care provider. Make sure you discuss any questions you have with your health care provider. Document Revised: 09/17/2021 Document Reviewed: 09/17/2021 Elsevier Patient Education  2024 [employer].

## 2024-07-14 ENCOUNTER — Telehealth: Payer: Self-pay

## 2024-07-14 NOTE — Telephone Encounter (Signed)
 [facility] Patient Advocate Encounter  Patient's medications APRETUDE  have been couriered to [facility] from [facility] and will be administered at the patients appointment on 07/19/24.  Charmaine Sharps, CPhT Specialty Pharmacy Patient [facility] Phone: [phone] Fax:  [phone]

## 2024-07-17 NOTE — Progress Notes (Unsigned)
 HPI: Jonathan White is a 33 y.o. male who presents to the [facility] pharmacy clinic for Apretude  administration and HIV PrEP follow up.  Insured   [x]    Uninsured  []    Patient Active Problem List   Diagnosis Date Noted   SOB (shortness of breath) 06/09/2024   Chronic bilateral low back pain without sciatica 06/09/2024   Atypical chest pain 06/09/2024   Attention deficit hyperactivity disorder (ADHD), combined type 06/09/2024   Class 1 obesity due to excess calories with serious comorbidity and body mass index (BMI) of 34.0 to 34.9 in adult 02/11/2024   Abnormal electrocardiogram (ECG) (EKG) 02/11/2024   Bipolar affective disorder, depressed, severe (HCC) 12/24/2021   Hemorrhoids, internal, with bleeding 05/15/2021   Vitamin D deficiency disease 04/12/2021   Primary hypertension 04/11/2021   High risk homosexual behavior 04/11/2021   Encounter for general adult medical examination with abnormal findings 04/11/2021   ADHD (attention deficit hyperactivity disorder), inattentive type 04/11/2021   Bipolar disorder (HCC) 02/14/2011    Patient's Medications  New Prescriptions   No medications on file  Previous Medications   APRETUDE  600 MG/3ML INJECTION    INJECT 3 ML INTO THE MUSCLE EVERY 2 MONTHS   CALCIUM POLYCARBOPHIL (FIBER-CAPS PO)    Take 1 capsule by mouth in the morning and at bedtime.   INDAPAMIDE  (LOZOL ) 1.25 MG TABLET    Take 1 tablet (1.25 mg total) by mouth daily.  Modified Medications   No medications on file  Discontinued Medications   No medications on file    Allergies: No Known Allergies  Labs: Lab Results  Component Value Date   HIV1RNAQUANT NOT DETECTED 05/17/2024   HIV1RNAQUANT NOT DETECTED 03/22/2024   HIV1RNAQUANT Not Detected 01/26/2024    RPR and STI Lab Results  Component Value Date   LABRPR NON-REACTIVE 05/17/2024   LABRPR NON-REACTIVE 03/22/2024   LABRPR NON-REACTIVE 01/26/2024   LABRPR NON-REACTIVE 01/05/2024   LABRPR NON-REACTIVE  11/24/2023    STI Results GC GC CT CT  05/17/2024 11:11 AM Negative    Negative   Negative    Negative    03/22/2024 11:03 AM Negative    Negative    Negative   Negative    Negative    Negative    01/26/2024 10:05 AM Negative    Negative    Negative   Negative    Negative    Negative    01/05/2024  2:43 PM Negative    Negative    Negative   Negative    Negative    Negative    09/23/2023  9:49 AM Negative    Negative   Negative    Negative    08/04/2023 10:11 AM Negative   Negative    07/21/2023 10:47 AM Positive    Negative   Negative    Negative    06/16/2023 10:52 AM Negative    Negative   Negative    Negative    03/31/2023  3:37 PM Negative    Negative   Negative    Negative    01/20/2023  2:35 PM Negative    Negative    Negative   Negative    Negative    Negative    12/23/2022 11:02 AM Negative   Negative    12/23/2022 10:26 AM Negative   Negative    12/05/2020 12:00 AM Negative   Negative    06/22/2020 12:00 AM Negative   Negative    05/22/2016  2:30 PM  NOT  DETECTED   NOT DETECTED   03/21/2016 12:00 AM Negative   Negative      Hepatitis B Lab Results  Component Value Date   HEPBSAG NON-REACTIVE 10/08/2022   HEPBCAB NON-REACTIVE 04/11/2021   Hepatitis C Lab Results  Component Value Date   HEPCAB NON-REACTIVE 04/11/2021   Hepatitis A Lab Results  Component Value Date   HAV BORDERLINE (A) 04/11/2021   Lipids: Lab Results  Component Value Date   CHOL 155 12/24/2021   TRIG 58 12/24/2021   HDL 38 (L) 12/24/2021   CHOLHDL 4.1 12/24/2021   VLDL 12 12/24/2021   LDLCALC 105 (H) 12/24/2021    TARGET DATE: 30  Assessment: Jonathan White presents today for his Apretude  injection and to follow up for HIV PrEP. No issues with past injections. Denies any symptoms of acute HIV. Last HIV RNA was negative on 05/17/24. Will check HIV RNA today. Last STI screening was on 05/17/24 and was negative. No known exposures to any STIs since last visit. Agrees to STI  testing today.    Per [employer] guidelines, a rapid HIV test should be drawn prior to Apretude  administration. Due to state shortage of rapid HIV tests, this is temporarily unable to be done. Per decision from [facility] physicians, we will proceed with Apretude  administration at this time without a negative rapid HIV test beforehand. HIV RNA was collected today and is in process.  Administered cabotegravir  600mg /66mL in right upper outer quadrant of the gluteal muscle. Will see Jonathan White back in 2 months for injection, labs, and HIV PrEP follow up.  Plan: - Apretude  injection administered - HIV RNA today - STI testing - Next injection, labs, and PrEP follow up appointment scheduled for 09/20/24 - Call with any issues or questions  Elma Fail, PharmD PGY1 Clinical Pharmacist [facility]  07/19/2024 11:00 AM

## 2024-07-19 ENCOUNTER — Other Ambulatory Visit: Payer: Self-pay

## 2024-07-19 ENCOUNTER — Ambulatory Visit (INDEPENDENT_AMBULATORY_CARE_PROVIDER_SITE_OTHER): Admitting: Pharmacist

## 2024-07-19 ENCOUNTER — Other Ambulatory Visit (HOSPITAL_COMMUNITY)
Admission: RE | Admit: 2024-07-19 | Discharge: 2024-07-19 | Disposition: A | Discharge status: Home / Self Care | Source: Ambulatory Visit | Attending: Infectious Disease | Admitting: Infectious Disease

## 2024-07-19 DIAGNOSIS — Z2981 Encounter for HIV pre-exposure prophylaxis: Secondary | ICD-10-CM

## 2024-07-19 DIAGNOSIS — Z113 Encounter for screening for infections with a predominantly sexual mode of transmission: Secondary | ICD-10-CM

## 2024-07-19 DIAGNOSIS — Z79899 Other long term (current) drug therapy: Secondary | ICD-10-CM

## 2024-07-19 MED ORDER — CABOTEGRAVIR ER 600 MG/3ML IM SUER
600.0000 mg | Freq: Once | INTRAMUSCULAR | Status: AC
Start: 2024-07-19 — End: 2024-07-19
  Administered 2024-07-19: 600 mg via INTRAMUSCULAR

## 2024-07-20 LAB — CYTOLOGY, (ORAL, ANAL, URETHRAL) ANCILLARY ONLY
Chlamydia: NEGATIVE
Comment: NEGATIVE
Comment: NORMAL
Neisseria Gonorrhea: NEGATIVE

## 2024-07-20 LAB — URINE CYTOLOGY ANCILLARY ONLY
Chlamydia: NEGATIVE
Comment: NEGATIVE
Comment: NORMAL
Neisseria Gonorrhea: NEGATIVE

## 2024-07-21 LAB — HIV-1 RNA QUANT-NO REFLEX-BLD
HIV 1 RNA Quant: NOT DETECTED {copies}/mL
HIV-1 RNA Quant, Log: NOT DETECTED {Log_copies}/mL

## 2024-07-21 LAB — RPR: RPR Ser Ql: NONREACTIVE

## 2024-08-15 ENCOUNTER — Ambulatory Visit (INDEPENDENT_AMBULATORY_CARE_PROVIDER_SITE_OTHER): Admitting: Physical Medicine and Rehabilitation

## 2024-08-15 ENCOUNTER — Ambulatory Visit: Admitting: Internal Medicine

## 2024-08-15 ENCOUNTER — Other Ambulatory Visit (INDEPENDENT_AMBULATORY_CARE_PROVIDER_SITE_OTHER): Payer: Self-pay

## 2024-08-15 ENCOUNTER — Encounter: Payer: Self-pay | Admitting: Physical Medicine and Rehabilitation

## 2024-08-15 DIAGNOSIS — M7918 Myalgia, other site: Secondary | ICD-10-CM | POA: Diagnosis not present

## 2024-08-15 DIAGNOSIS — M545 Low back pain, unspecified: Secondary | ICD-10-CM | POA: Diagnosis not present

## 2024-08-15 DIAGNOSIS — G8929 Other chronic pain: Secondary | ICD-10-CM | POA: Diagnosis not present

## 2024-08-15 NOTE — Patient Instructions (Signed)

## 2024-08-15 NOTE — Progress Notes (Unsigned)
 Core Outcome Measures Index (COMI) Back Score  Average Pain 5  COMI Score 50 %

## 2024-08-15 NOTE — Progress Notes (Unsigned)
 Pain Scale   Average Pain 6 Patient advising he has lower back pain that gets worse when standing and laying down. Patient advising when he straightens his legs his pain lessens.        +Driver, -BT, -Dye Allergies.

## 2024-08-15 NOTE — Progress Notes (Unsigned)
 Jonathan White - 33 y.o. male MRN [id number]  Date of birth: February 23, 1991  Office Visit Note: Visit Date: 08/15/2024 PCP: Joshua Debby CROME, MD Referred by: Joshua Debby CROME, MD  Subjective: Chief Complaint  Patient presents with   Lower Back - Pain   HPI: Jonathan White is a 33 y.o. male who comes in today per the request of Dr. Debby Joshua for evaluation of chronic, worsening and severe bilateral lower back pain. Pain ongoing for several years. He was told about 10 years ago that his lower back was fusing together. His pain worsens with prolonged standing, sitting and laying. He describes pain as tight sensation, currently rates as 7 out of 10. Some relief of pain with home exercise regimen, handheld massage, rest and use of medications. No history of formal physical therapy. No prior imaging of lumbar spine. Patient denies focal weakness, numbness and tingling. No recent trauma or falls.   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 He does have history of Bipolar Disorder.      Review of Systems  Musculoskeletal:  Positive for back pain and myalgias.  Neurological:  Negative for tingling, sensory change, focal weakness and weakness.  All other systems reviewed and are negative.  Otherwise per HPI.  Assessment & Plan: Visit Diagnoses:    ICD-10-CM   1. Chronic bilateral low back pain without sciatica  M54.50 XR Lumbar Spine 2-3 Views   G89.29 Ambulatory referral to Physical Therapy    2. Myofascial pain syndrome  M79.18 Ambulatory referral to Physical Therapy       Plan: Findings:  Chronic, worsening and severe bilateral lower back pain. No radicular symptoms down the legs. Patient continues to have pain despite good conservative therapies such as home exercise regimen, rest and use of medications. Patients clinical presentation and exam are consistent with mechanical lower back pain/myofascial pain syndrome. I  obtained lumbar radiographs in the office today that shows normal segmentation and alignment, well preserved disc spacing, no spondylolisthesis. We discussed treatment plan in detail today. I do think he would benefit from short course of formal physical therapy with a focus on core strengthening and home exercise regimen. He is limited on time, however did ask that I proceed with referral. I also provided him with Dr. Glean McGill's Big Three exercises to start at home. He has no questions at this time. We are happy to see him back as needed. No red flag symptoms noted upon exam today.     Meds & Orders: No orders of the defined types were placed in this encounter.   Orders Placed This Encounter  Procedures   XR Lumbar Spine 2-3 Views   Ambulatory referral to Physical Therapy    Follow-up: Return if symptoms worsen or fail to improve.   Procedures: No procedures performed      Clinical History: No specialty comments available.   He reports that he has never smoked. He has never been exposed to tobacco smoke. He has never used smokeless tobacco.  Recent Labs    02/11/24 1601  HGBA1C 4.8    Objective:  VS:  HT:    WT:   BMI:     BP:   HR: bpm  TEMP: ( )  RESP:  Physical Exam Vitals and nursing note reviewed.  HENT:     Head: Normocephalic and atraumatic.     Right Ear: External ear normal.     Left Ear: External  ear normal.     Nose: Nose normal.     Mouth/Throat:     Mouth: Mucous membranes are moist.  Eyes:     Extraocular Movements: Extraocular movements intact.  Cardiovascular:     Rate and Rhythm: Normal rate.     Pulses: Normal pulses.  Pulmonary:     Effort: Pulmonary effort is normal.  Abdominal:     General: Abdomen is flat. There is no distension.  Musculoskeletal:        General: Tenderness present.     Cervical back: Normal range of motion.     Comments: Patient rises from seated position to standing without difficulty. Good lumbar range of motion.  No pain noted with facet loading. 5/5 strength noted with bilateral hip flexion, knee flexion/extension, ankle dorsiflexion/plantarflexion and EHL. No clonus noted bilaterally. No pain upon palpation of greater trochanters. No pain with internal/external rotation of bilateral hips. Sensation intact bilaterally. Myofascial tenderness noted to bilateral lumbar paraspinal regions upon palpation. Negative slump test bilaterally. Ambulates without aid, gait steady.     Skin:    General: Skin is warm and dry.     Capillary Refill: Capillary refill takes less than 2 seconds.  Neurological:     General: No focal deficit present.     Mental Status: He is alert and oriented to person, place, and time.  Psychiatric:        Mood and Affect: Mood normal.        Behavior: Behavior normal.     Ortho Exam  Imaging: XR Lumbar Spine 2-3 Views Result Date: 08/15/2024 AP and lateral radiographs of lumbar spine show normal segmentation and alignment, well preserved disc spacing, no spondylolisthesis. No fractures or dislocations.    Past Medical/Family/Surgical/Social History: Medications & Allergies reviewed per EMR, new medications updated. Patient Active Problem List   Diagnosis Date Noted   SOB (shortness of breath) 06/09/2024   Chronic bilateral low back pain without sciatica 06/09/2024   Atypical chest pain 06/09/2024   Attention deficit hyperactivity disorder (ADHD), combined type 06/09/2024   Class 1 obesity due to excess calories with serious comorbidity and body mass index (BMI) of 34.0 to 34.9 in adult 02/11/2024   Abnormal electrocardiogram (ECG) (EKG) 02/11/2024   Bipolar affective disorder, depressed, severe (HCC) 12/24/2021   Hemorrhoids, internal, with bleeding 05/15/2021   Vitamin D deficiency disease 04/12/2021   Primary hypertension 04/11/2021   High risk homosexual behavior 04/11/2021   Encounter for general adult medical examination with abnormal findings 04/11/2021   ADHD  (attention deficit hyperactivity disorder), inattentive type 04/11/2021   Bipolar disorder (HCC) 02/14/2011   Past Medical History:  Diagnosis Date   ADHD    Bipolar 1 disorder (HCC)    Bipolar affective disorder (HCC)    Depression    Family History  Problem Relation Age of Onset   Hypertension Father    Stroke Father    Alcohol abuse Sister    Mental illness Sister    Schizophrenia Brother    Mental illness Brother    Colon cancer Neg Hx    Esophageal cancer Neg Hx    Pancreatic cancer Neg Hx    Stomach cancer Neg Hx    Liver disease Neg Hx    Past Surgical History:  Procedure Laterality Date   CARDIAC CATHETERIZATION Left 01/22/2018   NO PAST SURGERIES     Social History   Occupational History   Occupation: Dentist: [employer]  Tobacco Use  Smoking status: Never    Passive exposure: Never   Smokeless tobacco: Never  Vaping Use   Vaping status: Never Used  Substance and Sexual Activity   Alcohol use: Yes    Alcohol/week: 2.0 standard drinks of alcohol    Types: 2 Cans of beer per week   Drug use: Yes    Frequency: 7.0 times per week    Types: Marijuana, PCP    Comment: smoked marijuana this morning at 10:30am   Sexual activity: Yes    Partners: Male    Birth control/protection: Condom

## 2024-08-22 ENCOUNTER — Other Ambulatory Visit: Payer: Self-pay | Admitting: Pharmacist

## 2024-08-22 DIAGNOSIS — Z79899 Other long term (current) drug therapy: Secondary | ICD-10-CM

## 2024-09-15 ENCOUNTER — Telehealth: Payer: Self-pay

## 2024-09-15 NOTE — Telephone Encounter (Signed)
 [facility] Patient Advocate Encounter  Patient's medications Apretude  have been couriered to [facility] from [facility] and will be administered at the patients appointment on 09/20/24.  Arland Hutchinson, CPhT Specialty Pharmacy Patient [facility] Phone: [phone] Fax:  [phone]

## 2024-09-19 NOTE — Progress Notes (Unsigned)
 HPI: Jonathan White is a 33 y.o. male who presents to the [facility] pharmacy clinic for Apretude  administration and HIV PrEP follow up.  Insured   [x]    Uninsured  []    Referring ID Physician: Dr. Overton  Patient Active Problem List   Diagnosis Date Noted   SOB (shortness of breath) 06/09/2024   Chronic bilateral low back pain without sciatica 06/09/2024   Atypical chest pain 06/09/2024   Attention deficit hyperactivity disorder (ADHD), combined type 06/09/2024   Class 1 obesity due to excess calories with serious comorbidity and body mass index (BMI) of 34.0 to 34.9 in adult 02/11/2024   Abnormal electrocardiogram (ECG) (EKG) 02/11/2024   Bipolar affective disorder, depressed, severe (HCC) 12/24/2021   Hemorrhoids, internal, with bleeding 05/15/2021   Vitamin D deficiency disease 04/12/2021   Primary hypertension 04/11/2021   High risk homosexual behavior 04/11/2021   Encounter for general adult medical examination with abnormal findings 04/11/2021   ADHD (attention deficit hyperactivity disorder), inattentive type 04/11/2021   Bipolar disorder (HCC) 02/14/2011    Patient's Medications  New Prescriptions   No medications on file  Previous Medications   APRETUDE  600 MG/3ML INJECTION    INJECT 3 ML INTO THE MUSCLE EVERY 2 MONTHS   CALCIUM POLYCARBOPHIL (FIBER-CAPS PO)    Take 1 capsule by mouth in the morning and at bedtime.   INDAPAMIDE  (LOZOL ) 1.25 MG TABLET    Take 1 tablet (1.25 mg total) by mouth daily.  Modified Medications   No medications on file  Discontinued Medications   No medications on file    Allergies: No Known Allergies  Past Medical History: Past Medical History:  Diagnosis Date   ADHD    Bipolar 1 disorder (HCC)    Bipolar affective disorder (HCC)    Depression     Social History: Social History   Socioeconomic History   Marital status: Married    Spouse name: Not on file   Number of children: Not on file   Years of education: Not on file    Highest education level: Not on file  Occupational History   Occupation: Consulting Civil Engineer    Employer: [employer]  Tobacco Use   Smoking status: Never    Passive exposure: Never   Smokeless tobacco: Never  Vaping Use   Vaping status: Never Used  Substance and Sexual Activity   Alcohol use: Yes    Alcohol/week: 2.0 standard drinks of alcohol    Types: 2 Cans of beer per week   Drug use: Yes    Frequency: 7.0 times per week    Types: Marijuana, PCP    Comment: smoked marijuana this morning at 10:30am   Sexual activity: Yes    Partners: Male    Birth control/protection: Condom  Other Topics Concern   Not on file  Social History Narrative   ** Merged History Encounter **       Lives with mother   Parents divorced    Current student at [employer]   Part time job at [employer]      1 brother ,  1 sister   Brother hops around, sister lives in [place]      Father has htn   No cancer   Social Drivers of Corporate Investment Banker Strain: Not on file  Food Insecurity: Not on file  Transportation Needs: Not on file  Physical Activity: Not on file  Stress: Not on file  Social Connections: Unknown (03/28/2022)   Received from [facility]   Social Network    Social Network: Not on file    Labs: Lab Results  Component Value Date   HIV1RNAQUANT NOT DETECTED 07/19/2024   HIV1RNAQUANT NOT DETECTED 05/17/2024   HIV1RNAQUANT NOT DETECTED 03/22/2024    RPR and STI Lab Results  Component Value Date   LABRPR NON-REACTIVE 07/19/2024   LABRPR NON-REACTIVE 05/17/2024   LABRPR NON-REACTIVE 03/22/2024   LABRPR NON-REACTIVE 01/26/2024   LABRPR NON-REACTIVE 01/05/2024    STI Results GC GC CT CT  07/19/2024 10:56 AM Negative    Negative   Negative    Negative    05/17/2024 11:11 AM Negative    Negative   Negative    Negative    03/22/2024 11:03 AM Negative    Negative    Negative   Negative    Negative    Negative    01/26/2024 10:05 AM Negative    Negative    Negative    Negative    Negative    Negative    01/05/2024  2:43 PM Negative    Negative    Negative   Negative    Negative    Negative    09/23/2023  9:49 AM Negative    Negative   Negative    Negative    08/04/2023 10:11 AM Negative   Negative    07/21/2023 10:47 AM Positive    Negative   Negative    Negative    06/16/2023 10:52 AM Negative    Negative   Negative    Negative    03/31/2023  3:37 PM Negative    Negative   Negative    Negative    01/20/2023  2:35 PM Negative    Negative    Negative   Negative    Negative    Negative    12/23/2022 11:02 AM Negative   Negative    12/23/2022 10:26 AM Negative   Negative    12/05/2020 12:00 AM Negative   Negative    06/22/2020 12:00 AM Negative   Negative    05/22/2016  2:30 PM  NOT DETECTED   NOT DETECTED   03/21/2016 12:00 AM Negative   Negative      Hepatitis B Lab Results  Component Value Date   HEPBSAG NON-REACTIVE 10/08/2022   HEPBCAB NON-REACTIVE 04/11/2021   Hepatitis C Lab Results  Component Value Date   HEPCAB NON-REACTIVE 04/11/2021   Hepatitis A Lab Results  Component Value Date   HAV BORDERLINE (A) 04/11/2021   Lipids: Lab Results  Component Value Date   CHOL 155 12/24/2021   TRIG 58 12/24/2021   HDL 38 (L) 12/24/2021   CHOLHDL 4.1 12/24/2021   VLDL 12 12/24/2021   LDLCALC 105 (H) 12/24/2021    TARGET DATE: The 30th of the month  Assessment: Jonathan White presents today for their Apretude  injection and to follow up for HIV PrEP. No issues with past injections.  Screened patient for acute HIV symptoms such as fatigue, muscle aches, rash, sore throat, lymphadenopathy, headache, night sweats, nausea/vomiting/diarrhea, and fever. Patient denies any symptoms.   Administered cabotegravir  600mg /68mL in right upper outer quadrant of the gluteal muscle. Will make follow up appointments for maintenance injections every 2 months.   No known exposures to any STIs and no signs or symptoms of any STIs today. Last STI  screening was in August and was negative. No new partners since last injection; will check urine/oral cytologies and RPR today. Eligible for annual flu and COVID vaccines which  he accepts today.   Discussed Yeztugo for PrEP. Counseled patient that they will need to complete an oral loading dose. Counseled that patient will take two Sunlenca 300 mg tablets (600 mg total) orally on the first day of their injection and will take two Sunlenca 300 mg tablets (600 mg total) orally the next day regardless of meals. Counseled patient that Sunlenca is two separate subcutaneous injections in the abdomen every 6 months. Reviewed that the main side effects are injection-site soreness and nodules. Discussed measures for relief including cold packs and over-the-counter anti-inflammatories. He is very interested in this option and will plan to transition to [facility] at next appointment should insurance approve.   Plan: - Administer Apretude  600 mg x 1  - Check HIV RNA, RPR, and urine/oral cytologies - Administer flu and COVID vaccines - Investigate Yeztugo coverage - Start Yeztugo on 12/23 with Cassie if approved - Call with any issues or questions  Alan Geralds, PharmD, CPP, BCIDP, AAHIVP Clinical Pharmacist Practitioner Infectious Diseases Clinical Pharmacist [facility]

## 2024-09-20 ENCOUNTER — Other Ambulatory Visit (HOSPITAL_COMMUNITY)
Admission: RE | Admit: 2024-09-20 | Discharge: 2024-09-20 | Disposition: A | Discharge status: Home / Self Care | Source: Ambulatory Visit | Attending: Infectious Disease | Admitting: Infectious Disease

## 2024-09-20 ENCOUNTER — Ambulatory Visit: Payer: Self-pay | Admitting: Pharmacist

## 2024-09-20 ENCOUNTER — Telehealth: Payer: Self-pay

## 2024-09-20 ENCOUNTER — Other Ambulatory Visit (HOSPITAL_COMMUNITY): Payer: Self-pay

## 2024-09-20 ENCOUNTER — Other Ambulatory Visit: Payer: Self-pay

## 2024-09-20 DIAGNOSIS — Z79899 Other long term (current) drug therapy: Secondary | ICD-10-CM | POA: Diagnosis not present

## 2024-09-20 DIAGNOSIS — Z23 Encounter for immunization: Secondary | ICD-10-CM | POA: Diagnosis not present

## 2024-09-20 DIAGNOSIS — Z113 Encounter for screening for infections with a predominantly sexual mode of transmission: Secondary | ICD-10-CM | POA: Insufficient documentation

## 2024-09-20 MED ORDER — CABOTEGRAVIR ER 600 MG/3ML IM SUER
600.0000 mg | Freq: Once | INTRAMUSCULAR | Status: AC
  Administered 2024-09-20: 600 mg via INTRAMUSCULAR

## 2024-09-20 NOTE — Telephone Encounter (Signed)
 Received a fax regarding Prior Authorization from [facility] for [facility] Authorization has been DENIED because Step therapy criteria not met; patient must try up to three preferred drugs (Apretude , Descovy , emtricitabine/tenofovir disoproxil fumarate) before coverage .  J code: CPT:  Fax# [phone]

## 2024-09-20 NOTE — Telephone Encounter (Signed)
 Submitted a Prior Authorization request to [facility] for [facility] via Latent. Will update once we receive a response.  J Code: CPT:  PA ID: [id number]

## 2024-09-21 ENCOUNTER — Encounter: Payer: Self-pay | Admitting: Pharmacist

## 2024-09-21 LAB — URINE CYTOLOGY ANCILLARY ONLY
Chlamydia: NEGATIVE
Comment: NEGATIVE
Comment: NORMAL
Neisseria Gonorrhea: NEGATIVE

## 2024-09-21 LAB — CYTOLOGY, (ORAL, ANAL, URETHRAL) ANCILLARY ONLY
Chlamydia: NEGATIVE
Comment: NEGATIVE
Comment: NORMAL
Neisseria Gonorrhea: NEGATIVE

## 2024-09-21 NOTE — Telephone Encounter (Signed)
 Will continue with Apretude  at this time. Sent patient a MyChart message discussing denial. Maybe we'll have better luck next year!

## 2024-09-22 LAB — HIV-1 RNA QUANT-NO REFLEX-BLD
HIV 1 RNA Quant: NOT DETECTED {copies}/mL
HIV-1 RNA Quant, Log: NOT DETECTED {Log_copies}/mL

## 2024-09-22 LAB — RPR: RPR Ser Ql: NONREACTIVE

## 2024-09-26 ENCOUNTER — Encounter: Payer: Self-pay | Admitting: Radiology

## 2024-11-10 ENCOUNTER — Telehealth: Payer: Self-pay

## 2024-11-10 NOTE — Telephone Encounter (Signed)
 [facility] Patient Advocate Encounter  Patient's medications APRETUDE  have been couriered to [facility] from [facility] and will be administered at the patients appointment on 11/15/24.  Charmaine Sharps, CPhT Specialty Pharmacy Patient [facility] Phone: [phone] Fax:  [phone]

## 2024-11-14 NOTE — Progress Notes (Unsigned)
 "  HPI: Jonathan White is a 33 y.o. male who presents to the [facility] pharmacy clinic for Apretude  administration and HIV PrEP follow up.  Referring ID Provider: Dr. Overton   Patient Active Problem List   Diagnosis Date Noted   SOB (shortness of breath) 06/09/2024   Chronic bilateral low back pain without sciatica 06/09/2024   Atypical chest pain 06/09/2024   Attention deficit hyperactivity disorder (ADHD), combined type 06/09/2024   Class 1 obesity due to excess calories with serious comorbidity and body mass index (BMI) of 34.0 to 34.9 in adult 02/11/2024   Abnormal electrocardiogram (ECG) (EKG) 02/11/2024   Bipolar affective disorder, depressed, severe (HCC) 12/24/2021   Hemorrhoids, internal, with bleeding 05/15/2021   Vitamin D deficiency disease 04/12/2021   Primary hypertension 04/11/2021   High risk homosexual behavior 04/11/2021   Encounter for general adult medical examination with abnormal findings 04/11/2021   ADHD (attention deficit hyperactivity disorder), inattentive type 04/11/2021   Bipolar disorder (HCC) 02/14/2011    Patient's Medications  New Prescriptions   No medications on file  Previous Medications   APRETUDE  600 MG/3ML INJECTION    INJECT 3 ML INTO THE MUSCLE EVERY 2 MONTHS   CALCIUM POLYCARBOPHIL (FIBER-CAPS PO)    Take 1 capsule by mouth in the morning and at bedtime.   INDAPAMIDE  (LOZOL ) 1.25 MG TABLET    Take 1 tablet (1.25 mg total) by mouth daily.  Modified Medications   No medications on file  Discontinued Medications   No medications on file    Allergies: Allergies[1]  Labs: Lab Results  Component Value Date   HIV1RNAQUANT NOT DETECTED 09/20/2024   HIV1RNAQUANT NOT DETECTED 07/19/2024   HIV1RNAQUANT NOT DETECTED 05/17/2024    RPR and STI Lab Results  Component Value Date   LABRPR NON-REACTIVE 09/20/2024   LABRPR NON-REACTIVE 07/19/2024   LABRPR NON-REACTIVE 05/17/2024   LABRPR NON-REACTIVE 03/22/2024   LABRPR NON-REACTIVE 01/26/2024     STI Results GC GC CT CT  09/20/2024 10:55 AM Negative    Negative   Negative    Negative    07/19/2024 10:56 AM Negative    Negative   Negative    Negative    05/17/2024 11:11 AM Negative    Negative   Negative    Negative    03/22/2024 11:03 AM Negative    Negative    Negative   Negative    Negative    Negative    01/26/2024 10:05 AM Negative    Negative    Negative   Negative    Negative    Negative    01/05/2024  2:43 PM Negative    Negative    Negative   Negative    Negative    Negative    09/23/2023  9:49 AM Negative    Negative   Negative    Negative    08/04/2023 10:11 AM Negative   Negative    07/21/2023 10:47 AM Positive    Negative   Negative    Negative    06/16/2023 10:52 AM Negative    Negative   Negative    Negative    03/31/2023  3:37 PM Negative    Negative   Negative    Negative    01/20/2023  2:35 PM Negative    Negative    Negative   Negative    Negative    Negative    12/23/2022 11:02 AM Negative   Negative    12/23/2022 10:26 AM Negative  Negative    12/05/2020 12:00 AM Negative   Negative    06/22/2020 12:00 AM Negative   Negative    05/22/2016  2:30 PM  NOT DETECTED   NOT DETECTED   03/21/2016 12:00 AM Negative   Negative      Hepatitis B Lab Results  Component Value Date   HEPBSAG NON-REACTIVE 10/08/2022   HEPBCAB NON-REACTIVE 04/11/2021   Hepatitis C Lab Results  Component Value Date   HEPCAB NON-REACTIVE 04/11/2021   Hepatitis A Lab Results  Component Value Date   HAV BORDERLINE (A) 04/11/2021   Lipids: Lab Results  Component Value Date   CHOL 155 12/24/2021   TRIG 58 12/24/2021   HDL 38 (L) 12/24/2021   CHOLHDL 4.1 12/24/2021   VLDL 12 12/24/2021   LDLCALC 105 (H) 12/24/2021    Target Date: The 30th  Assessment: Jonathan White presents today for his Apretude  injection and to follow up for HIV PrEP. No issues with past injections. Denies any symptoms of acute HIV. Last HIV RNA was negative on October.    Of note, he wanted to transition to [place] but insurance denied. Will try again in 2026.   Routine labs:  HIV RNA and STI testing today  Eligible vaccinations:  Currently up to date on all recommended vaccines.   Apretude : Administered cabotegravir  600mg /29mL in right upper outer quadrant of the gluteal muscle. Will see him back in 2 months for next Apretude  injection, labs, and HIV PrEP follow up.  Plan: - Apretude  injection administered - HIV RNA, RPR, and urine/oral cytology today - Next injection, labs, and PrEP follow up appointment scheduled for 01/16/25 with me - Call with any issues or questions  Maitland Muhlbauer L. Anacaren Kohan, PharmD, BCIDP, AAHIVP, CPP Clinical Pharmacist Practitioner - Infectious Diseases Clinical Pharmacist Lead - Specialty Pharmacy [facility]     [1] No Known Allergies  "

## 2024-11-15 ENCOUNTER — Other Ambulatory Visit: Payer: Self-pay

## 2024-11-15 ENCOUNTER — Ambulatory Visit (INDEPENDENT_AMBULATORY_CARE_PROVIDER_SITE_OTHER): Admitting: Pharmacist

## 2024-11-15 DIAGNOSIS — Z79899 Other long term (current) drug therapy: Secondary | ICD-10-CM | POA: Diagnosis not present

## 2024-11-15 DIAGNOSIS — Z113 Encounter for screening for infections with a predominantly sexual mode of transmission: Secondary | ICD-10-CM | POA: Diagnosis not present

## 2024-11-15 MED ORDER — CABOTEGRAVIR ER 600 MG/3ML IM SUER
600.0000 mg | Freq: Once | INTRAMUSCULAR | Status: AC
  Administered 2024-11-15: 600 mg via INTRAMUSCULAR

## 2024-11-16 LAB — GC/CHLAMYDIA PROBE, AMP (THROAT)
Chlamydia trachomatis RNA: NOT DETECTED
Neisseria gonorrhoeae RNA: NOT DETECTED

## 2024-11-16 LAB — C. TRACHOMATIS/N. GONORRHOEAE RNA
C. trachomatis RNA, TMA: NOT DETECTED
N. gonorrhoeae RNA, TMA: NOT DETECTED

## 2024-11-18 LAB — SYPHILIS: RPR W/REFLEX TO RPR TITER AND TREPONEMAL ANTIBODIES, TRADITIONAL SCREENING AND DIAGNOSIS ALGORITHM: RPR Ser Ql: NONREACTIVE

## 2024-11-18 LAB — HIV-1 RNA QUANT-NO REFLEX-BLD
HIV 1 RNA Quant: NOT DETECTED {copies}/mL
HIV-1 RNA Quant, Log: NOT DETECTED {Log_copies}/mL

## 2024-11-29 ENCOUNTER — Ambulatory Visit: Payer: Self-pay

## 2024-11-29 NOTE — Telephone Encounter (Signed)
 FYI Only or Action Required?: FYI only for provider: appointment scheduled on 11/30/24.  Patient was last seen in primary care on 06/09/2024 by Joshua Debby CROME, MD.  Called Nurse Triage reporting Shortness of Breath and Cough.  Symptoms began 1-2 days ago.  Interventions attempted: OTC medications: honey, throat lozenges.  Symptoms are: gradually worsening.  Triage Disposition: See Physician Within 24 Hours (overriding See HCP Within 4 Hours (Or PCP Triage))  Patient/caregiver understands and will follow disposition?: Yes            Copied from CRM #[id number]. Topic: Clinical - Red Word Triage >> Nov 29, 2024  8:39 AM Roselie BROCKS wrote: Red Word that prompted transfer to Nurse Triage: Patient states he is very congested, making it hard to breathe,and is coughing up really dark green mucus Reason for Disposition  [1] MILD difficulty breathing (e.g., minimal/no SOB at rest, SOB with walking, pulse < 100) AND [2] NEW-onset or WORSE than normal  Answer Assessment - Initial Assessment Questions 1. RESPIRATORY STATUS: Describe your breathing? (e.g., wheezing, shortness of breath, unable to speak, severe coughing)      Shortness of breath.  2. ONSET: When did this breathing problem begin?      Last night.  3. PATTERN Does the difficult breathing come and go, or has it been constant since it started?      Comes and goes; present last night when trying to lie down and sleep.  4. SEVERITY: How bad is your breathing? (e.g., mild, moderate, severe)      Mild. No SOB at rest. Able to speak in full sentence.  5. RECURRENT SYMPTOM: Have you had difficulty breathing before? If Yes, ask: When was the last time? and What happened that time?      Yes, he states other times he has been sick. Times he didn't get checked or seen for treatment.  6. CARDIAC HISTORY: Do you have any history of heart disease? (e.g., heart attack, angina, bypass surgery, angioplasty)      No.  7.  LUNG HISTORY: Do you have any history of lung disease?  (e.g., pulmonary embolus, asthma, emphysema)     No.  8. CAUSE: What do you think is causing the breathing problem?      Thought the throat irritation was caused by vape pen. He states he is concerned for an infection due to : Smoke flower substance and use a humidifier, looks like mold/furry build up   9. OTHER SYMPTOMS: Do you have any other symptoms? (e.g., chest pain, cough, dizziness, fever, runny nose)     Scratchy throat/hoarse x 1-2 days, productive cough with green mucous x couple days, pressure in cheeks, nasal drainage/congestion. No wheezing, chest pain, difficulty swallowing or drooling, prolonged travel, recent hospitalization, palpitations  10. O2 SATURATION MONITOR:  Do you use an oxygen saturation monitor (pulse oximeter) at home? If Yes, ask: What is your reading (oxygen level) today? What is your usual oxygen saturation reading? (e.g., 95%)       No.  11. PREGNANCY: Is there any chance you are pregnant? When was your last menstrual period?       N/A.  12. TRAVEL: Have you traveled out of the country in the last month? (e.g., travel history, exposures)       No known exposure to flu or COVID or travel.  Protocols used: Breathing Difficulty-A-AH

## 2024-11-30 ENCOUNTER — Encounter: Payer: Self-pay | Admitting: Internal Medicine

## 2024-11-30 ENCOUNTER — Ambulatory Visit: Admitting: Internal Medicine

## 2024-11-30 VITALS — BP 122/80 | HR 64 | Temp 99.5°F | Ht 72.0 in

## 2024-11-30 DIAGNOSIS — I10 Essential (primary) hypertension: Secondary | ICD-10-CM

## 2024-11-30 DIAGNOSIS — U071 COVID-19: Secondary | ICD-10-CM

## 2024-11-30 DIAGNOSIS — E559 Vitamin D deficiency, unspecified: Secondary | ICD-10-CM

## 2024-11-30 MED ORDER — NIRMATRELVIR/RITONAVIR (PAXLOVID)TABLET: 3.0000 | ORAL_TABLET | Freq: Two times a day (BID) | ORAL | 0 refills | Status: AC

## 2024-11-30 MED ORDER — HYDROCODONE BIT-HOMATROP MBR 5-1.5 MG/5ML PO SOLN: 5.0000 mL | Freq: Four times a day (QID) | ORAL | 0 refills | Status: AC | PRN

## 2024-11-30 MED ORDER — ALBUTEROL SULFATE HFA 108 (90 BASE) MCG/ACT IN AERS: 2.0000 | INHALATION_SPRAY | Freq: Four times a day (QID) | RESPIRATORY_TRACT | 0 refills | Status: AC | PRN

## 2024-11-30 NOTE — Assessment & Plan Note (Signed)
 Mild to mod, for paxlovid  course, cough med prn, albuterol  hfa prn, avoid other x 5 days from start of symptoms, pt to f/u any worsening symptoms or concerns

## 2024-11-30 NOTE — Assessment & Plan Note (Signed)
 BP Readings from Last 3 Encounters:  11/30/24 122/80  06/09/24 134/86  02/11/24 136/86   Stable, pt to continue medical treatment lozol  1.25 mg qd

## 2024-11-30 NOTE — Assessment & Plan Note (Signed)
 Last vitamin D Lab Results  Component Value Date   VD25OH 10.99 (L) 04/11/2021   Low, reminded to start oral replacement

## 2024-11-30 NOTE — Patient Instructions (Signed)
 Please take all new medication as prescribed- the antibiotic, cough medicine as needed, and in haler as needed  Please continue all other medications as before, and refills have been done if requested.  Please have the pharmacy call with any other refills you may need.  Please keep your appointments with your specialists as you may have planned

## 2024-11-30 NOTE — Progress Notes (Signed)
 Patient ID: Jonathan White, male   DOB: [birth year], 34 y.o.   MRN: [id number]        Chief Complaint: follow up covid infection       HPI:  Jonathan White is a 34 y.o. male here with c/o 3 days onset symptoms; Here with acute onset mild to mod 2-3 days ST, HA, general weakness and malaise, with prod cough greenish sputum, but Pt denies chest pain, increased sob or doe, wheezing, orthopnea, PND, increased LE swelling, palpitations, dizziness or syncope.   Pt denies polydipsia, polyuria, or new focal neuro s/s.   Pt tested + home covid yesterday.       Wt Readings from Last 3 Encounters:  06/09/24 260 lb (117.9 kg)  02/11/24 256 lb 9.6 oz (116.4 kg)  10/08/22 254 lb 2 oz (115.3 kg)   BP Readings from Last 3 Encounters:  11/30/24 122/80  06/09/24 134/86  02/11/24 136/86         Past Medical History:  Diagnosis Date   ADHD    Bipolar 1 disorder (HCC)    Bipolar affective disorder (HCC)    Depression    Past Surgical History:  Procedure Laterality Date   CARDIAC CATHETERIZATION Left 01/22/2018   NO PAST SURGERIES      reports that he has never smoked. He has never been exposed to tobacco smoke. He has never used smokeless tobacco. He reports current alcohol use of about 2.0 standard drinks of alcohol per week. He reports current drug use. Frequency: 7.00 times per week. Drugs: Marijuana and PCP. family history includes Alcohol abuse in his sister; Hypertension in his father; Mental illness in his brother and sister; Schizophrenia in his brother; Stroke in his father. Allergies[1] Medications Ordered Prior to Encounter[2]      ROS:  All others reviewed and negative.  Objective        PE:  BP 122/80 (BP Location: Right Arm, Patient Position: Sitting, Cuff Size: Normal)   Pulse 64   Temp 99.5 F (37.5 C) (Oral)   Ht 6' (1.829 m)   SpO2 98%   BMI 35.26 kg/m                 Constitutional: Pt appears mild ill               HENT: Head: NCAT.                Right Ear:  External ear normal.                 Left Ear: External ear normal. Bilat tm's with mild erythema.  Max sinus areas non tender.  Pharynx with mild erythema, no exudate               Eyes: . Pupils are equal, round, and reactive to light. Conjunctivae and EOM are normal               Nose: without d/c or deformity               Neck: Neck supple. Gross normal ROM               Cardiovascular: Normal rate and regular rhythm.                 Pulmonary/Chest: Effort normal and breath sounds dcreased without rales or wheezing.  Neurological: Pt is alert. At baseline orientation, motor grossly intact               Skin: Skin is warm. No rashes, no other new lesions, LE edema - none               Psychiatric: Pt behavior is normal without agitation   Micro: none  Cardiac tracings I have personally interpreted today:  none  Pertinent Radiological findings (summarize): none   Lab Results  Component Value Date   WBC 7.3 02/11/2024   HGB 14.1 02/11/2024   HCT 43.1 02/11/2024   PLT 294.0 02/11/2024   GLUCOSE 92 02/11/2024   CHOL 155 12/24/2021   TRIG 58 12/24/2021   HDL 38 (L) 12/24/2021   LDLCALC 105 (H) 12/24/2021   ALT 26 02/11/2024   AST 15 02/11/2024   NA 139 02/11/2024   K 4.1 02/11/2024   CL 106 02/11/2024   CREATININE 1.08 02/11/2024   BUN 15 02/11/2024   CO2 27 02/11/2024   TSH 0.41 02/11/2024   HGBA1C 4.8 02/11/2024   Assessment/Plan:  Jonathan White is a 34 y.o. Black or African American [2] male with  has a past medical history of ADHD, Bipolar 1 disorder (HCC), Bipolar affective disorder (HCC), and Depression.  COVID-19 virus infection Mild to mod, for paxlovid  course, cough med prn, albuterol  hfa prn, avoid other x 5 days from start of symptoms, pt to f/u any worsening symptoms or concerns  Primary hypertension BP Readings from Last 3 Encounters:  11/30/24 122/80  06/09/24 134/86  02/11/24 136/86   Stable, pt to continue medical  treatment lozol  1.25 mg qd   Vitamin D deficiency disease Last vitamin D Lab Results  Component Value Date   VD25OH 10.99 (L) 04/11/2021   Low, reminded to start oral replacement  Followup: Return if symptoms worsen or fail to improve.  Lynwood Rush, MD 11/30/2024 6:44 PM [facility]     [1] No Known Allergies [2]  Current Outpatient Medications on File Prior to Visit  Medication Sig Dispense Refill   APRETUDE  600 MG/3ML injection INJECT 3 ML INTO THE MUSCLE EVERY 2 MONTHS 3 mL 5   Calcium Polycarbophil (FIBER-CAPS PO) Take 1 capsule by mouth in the morning and at bedtime.     indapamide  (LOZOL ) 1.25 MG tablet Take 1 tablet (1.25 mg total) by mouth daily. 90 tablet 1   No current facility-administered medications on file prior to visit.

## 2024-12-22 ENCOUNTER — Other Ambulatory Visit (HOSPITAL_COMMUNITY): Payer: Self-pay

## 2025-01-16 ENCOUNTER — Ambulatory Visit: Payer: Self-pay | Admitting: Pharmacist
